# Patient Record
Sex: Female | Born: 1940 | Race: White | Hispanic: No
Health system: Southern US, Community
[De-identification: ages and names within clinical notes are randomized; demographics above are authoritative.]

---

## 2006-01-03 ENCOUNTER — Emergency Department: Payer: Self-pay | Admitting: Emergency Medicine

## 2006-01-07 ENCOUNTER — Ambulatory Visit: Payer: Self-pay | Admitting: Family Medicine

## 2006-12-15 ENCOUNTER — Ambulatory Visit: Payer: Self-pay | Admitting: Gastroenterology

## 2007-01-28 ENCOUNTER — Ambulatory Visit: Payer: Self-pay | Admitting: Family Medicine

## 2007-03-04 ENCOUNTER — Ambulatory Visit: Payer: Self-pay | Admitting: Family Medicine

## 2007-12-22 ENCOUNTER — Ambulatory Visit: Payer: Self-pay | Admitting: Gastroenterology

## 2008-01-09 ENCOUNTER — Ambulatory Visit: Payer: Self-pay | Admitting: Internal Medicine

## 2008-01-17 ENCOUNTER — Ambulatory Visit: Payer: Self-pay | Admitting: Internal Medicine

## 2008-01-28 ENCOUNTER — Inpatient Hospital Stay: Payer: Self-pay | Admitting: Internal Medicine

## 2008-01-28 ENCOUNTER — Other Ambulatory Visit: Payer: Self-pay

## 2008-02-06 ENCOUNTER — Ambulatory Visit: Payer: Self-pay | Admitting: Internal Medicine

## 2008-02-22 ENCOUNTER — Ambulatory Visit: Payer: Self-pay | Admitting: Gastroenterology

## 2008-03-08 ENCOUNTER — Ambulatory Visit: Payer: Self-pay | Admitting: Internal Medicine

## 2008-04-07 ENCOUNTER — Ambulatory Visit: Payer: Self-pay | Admitting: Internal Medicine

## 2008-05-08 ENCOUNTER — Ambulatory Visit: Payer: Self-pay | Admitting: Internal Medicine

## 2008-06-07 ENCOUNTER — Ambulatory Visit: Payer: Self-pay | Admitting: Internal Medicine

## 2008-06-28 ENCOUNTER — Ambulatory Visit: Payer: Self-pay | Admitting: Surgery

## 2008-07-05 ENCOUNTER — Ambulatory Visit: Payer: Self-pay | Admitting: Surgery

## 2008-07-08 ENCOUNTER — Ambulatory Visit: Payer: Self-pay | Admitting: Internal Medicine

## 2008-07-18 ENCOUNTER — Ambulatory Visit: Payer: Self-pay | Admitting: Internal Medicine

## 2008-08-08 ENCOUNTER — Ambulatory Visit: Payer: Self-pay | Admitting: Internal Medicine

## 2008-09-07 ENCOUNTER — Ambulatory Visit: Payer: Self-pay | Admitting: Internal Medicine

## 2008-10-08 ENCOUNTER — Ambulatory Visit: Payer: Self-pay | Admitting: Internal Medicine

## 2008-11-07 ENCOUNTER — Ambulatory Visit: Payer: Self-pay | Admitting: Internal Medicine

## 2008-12-08 ENCOUNTER — Ambulatory Visit: Payer: Self-pay | Admitting: Internal Medicine

## 2008-12-14 ENCOUNTER — Ambulatory Visit: Payer: Self-pay | Admitting: Internal Medicine

## 2008-12-20 ENCOUNTER — Ambulatory Visit: Payer: Self-pay | Admitting: Specialist

## 2009-01-03 ENCOUNTER — Ambulatory Visit: Payer: Self-pay | Admitting: Specialist

## 2009-01-06 ENCOUNTER — Emergency Department: Payer: Self-pay | Admitting: Unknown Physician Specialty

## 2009-01-08 ENCOUNTER — Ambulatory Visit: Payer: Self-pay | Admitting: Internal Medicine

## 2009-03-08 ENCOUNTER — Ambulatory Visit: Payer: Self-pay | Admitting: Internal Medicine

## 2009-03-19 ENCOUNTER — Ambulatory Visit: Payer: Self-pay | Admitting: Family Medicine

## 2009-03-22 ENCOUNTER — Ambulatory Visit: Payer: Self-pay | Admitting: Internal Medicine

## 2009-04-03 ENCOUNTER — Ambulatory Visit: Payer: Self-pay | Admitting: Gastroenterology

## 2009-04-07 ENCOUNTER — Ambulatory Visit: Payer: Self-pay | Admitting: Internal Medicine

## 2009-05-08 ENCOUNTER — Ambulatory Visit: Payer: Self-pay | Admitting: Internal Medicine

## 2009-06-07 ENCOUNTER — Ambulatory Visit: Payer: Self-pay | Admitting: Internal Medicine

## 2009-07-08 ENCOUNTER — Ambulatory Visit: Payer: Self-pay | Admitting: Internal Medicine

## 2009-08-16 ENCOUNTER — Ambulatory Visit: Payer: Self-pay | Admitting: Internal Medicine

## 2009-09-07 ENCOUNTER — Ambulatory Visit: Payer: Self-pay | Admitting: Internal Medicine

## 2009-10-08 ENCOUNTER — Ambulatory Visit: Payer: Self-pay | Admitting: Internal Medicine

## 2009-10-15 ENCOUNTER — Ambulatory Visit: Payer: Self-pay | Admitting: Internal Medicine

## 2009-11-07 ENCOUNTER — Ambulatory Visit: Payer: Self-pay | Admitting: Internal Medicine

## 2009-12-17 ENCOUNTER — Ambulatory Visit: Payer: Self-pay | Admitting: Internal Medicine

## 2010-01-08 ENCOUNTER — Ambulatory Visit: Payer: Self-pay | Admitting: Internal Medicine

## 2010-01-21 ENCOUNTER — Ambulatory Visit: Payer: Self-pay | Admitting: Gastroenterology

## 2010-02-14 ENCOUNTER — Ambulatory Visit: Payer: Self-pay | Admitting: Internal Medicine

## 2010-03-08 ENCOUNTER — Ambulatory Visit: Payer: Self-pay | Admitting: Internal Medicine

## 2010-03-20 ENCOUNTER — Ambulatory Visit: Payer: Self-pay | Admitting: Family Medicine

## 2010-04-07 ENCOUNTER — Ambulatory Visit: Payer: Self-pay | Admitting: Internal Medicine

## 2010-04-15 ENCOUNTER — Ambulatory Visit: Payer: Self-pay | Admitting: Internal Medicine

## 2010-05-08 ENCOUNTER — Ambulatory Visit: Payer: Self-pay | Admitting: Internal Medicine

## 2010-06-07 ENCOUNTER — Ambulatory Visit: Payer: Self-pay | Admitting: Internal Medicine

## 2010-06-24 ENCOUNTER — Ambulatory Visit: Payer: Self-pay | Admitting: Gastroenterology

## 2010-07-08 ENCOUNTER — Ambulatory Visit: Payer: Self-pay | Admitting: Internal Medicine

## 2010-07-25 ENCOUNTER — Ambulatory Visit: Payer: Self-pay | Admitting: Gastroenterology

## 2010-08-08 ENCOUNTER — Ambulatory Visit: Payer: Self-pay | Admitting: Internal Medicine

## 2010-08-26 ENCOUNTER — Inpatient Hospital Stay: Payer: Self-pay | Admitting: Internal Medicine

## 2010-09-07 ENCOUNTER — Ambulatory Visit: Payer: Self-pay | Admitting: Internal Medicine

## 2010-09-16 ENCOUNTER — Ambulatory Visit: Payer: Self-pay | Admitting: Family Medicine

## 2010-09-19 ENCOUNTER — Ambulatory Visit: Payer: Self-pay | Admitting: Gastroenterology

## 2010-09-20 ENCOUNTER — Emergency Department: Payer: Self-pay | Admitting: Emergency Medicine

## 2010-09-22 ENCOUNTER — Inpatient Hospital Stay: Payer: Self-pay | Admitting: Internal Medicine

## 2010-10-08 ENCOUNTER — Ambulatory Visit: Payer: Self-pay | Admitting: Internal Medicine

## 2010-10-16 ENCOUNTER — Emergency Department: Payer: Self-pay | Admitting: Emergency Medicine

## 2010-11-04 ENCOUNTER — Ambulatory Visit: Payer: Self-pay | Admitting: Urology

## 2010-11-07 ENCOUNTER — Ambulatory Visit: Payer: Self-pay | Admitting: Internal Medicine

## 2010-11-11 ENCOUNTER — Observation Stay: Payer: Self-pay | Admitting: Internal Medicine

## 2010-12-08 ENCOUNTER — Ambulatory Visit: Payer: Self-pay | Admitting: Internal Medicine

## 2011-01-08 ENCOUNTER — Ambulatory Visit: Payer: Self-pay | Admitting: Internal Medicine

## 2011-02-06 ENCOUNTER — Ambulatory Visit: Payer: Self-pay | Admitting: Internal Medicine

## 2011-02-23 ENCOUNTER — Emergency Department: Payer: Self-pay | Admitting: Emergency Medicine

## 2011-03-01 ENCOUNTER — Emergency Department: Payer: Self-pay | Admitting: Emergency Medicine

## 2011-03-04 LAB — CEA: CEA: 4.3 ng/mL (ref 0.0–4.7)

## 2011-03-09 ENCOUNTER — Ambulatory Visit: Payer: Self-pay | Admitting: Internal Medicine

## 2011-04-08 ENCOUNTER — Ambulatory Visit: Payer: Self-pay | Admitting: Internal Medicine

## 2011-04-25 ENCOUNTER — Emergency Department: Payer: Self-pay | Admitting: Emergency Medicine

## 2011-05-09 ENCOUNTER — Ambulatory Visit: Payer: Self-pay | Admitting: Internal Medicine

## 2011-05-23 ENCOUNTER — Observation Stay: Payer: Self-pay | Admitting: Internal Medicine

## 2011-06-08 ENCOUNTER — Ambulatory Visit: Payer: Self-pay | Admitting: Internal Medicine

## 2011-06-18 ENCOUNTER — Ambulatory Visit: Payer: Self-pay | Admitting: Urology

## 2011-06-21 LAB — CA 125: CA 125: 447.2 U/mL — ABNORMAL HIGH (ref 0.0–34.0)

## 2011-07-09 ENCOUNTER — Ambulatory Visit: Payer: Self-pay | Admitting: Internal Medicine

## 2011-07-30 LAB — CA 125: CA 125: 152.5 U/mL — ABNORMAL HIGH (ref 0.0–34.0)

## 2011-08-01 LAB — CA 125: CA 125: 182.4 U/mL — ABNORMAL HIGH (ref 0.0–34.0)

## 2011-08-09 ENCOUNTER — Ambulatory Visit: Payer: Self-pay | Admitting: Internal Medicine

## 2011-09-08 ENCOUNTER — Ambulatory Visit: Payer: Self-pay | Admitting: Internal Medicine

## 2011-09-11 ENCOUNTER — Ambulatory Visit: Payer: Self-pay | Admitting: Vascular Surgery

## 2011-09-12 ENCOUNTER — Ambulatory Visit: Payer: Self-pay | Admitting: Internal Medicine

## 2011-10-09 ENCOUNTER — Ambulatory Visit: Payer: Self-pay | Admitting: Internal Medicine

## 2011-11-05 LAB — CA 125: CA 125: 814.8 U/mL — ABNORMAL HIGH (ref 0.0–34.0)

## 2011-11-07 LAB — CEA: CEA: 4.8 ng/mL — ABNORMAL HIGH (ref 0.0–4.7)

## 2011-11-08 ENCOUNTER — Ambulatory Visit: Payer: Self-pay | Admitting: Internal Medicine

## 2011-12-09 ENCOUNTER — Ambulatory Visit: Payer: Self-pay | Admitting: Internal Medicine

## 2011-12-10 LAB — CANCER CENTER HEMOGLOBIN: HGB: 10.1 g/dL — ABNORMAL LOW (ref 12.0–16.0)

## 2011-12-17 LAB — CANCER CENTER HEMOGLOBIN: HGB: 9.4 g/dL — ABNORMAL LOW (ref 12.0–16.0)

## 2011-12-22 LAB — CREATININE, SERUM
Creatinine: 1.13 mg/dL (ref 0.60–1.30)
EGFR (African American): >60
EGFR (Non-African Amer.): 50 — ABNORMAL LOW

## 2011-12-29 LAB — FERRITIN: Ferritin (ARMC): 57 ng/mL (ref 8–388)

## 2012-01-02 LAB — CANCER CENTER HEMOGLOBIN: HGB: 10.9 g/dL — ABNORMAL LOW (ref 12.0–16.0)

## 2012-01-09 ENCOUNTER — Ambulatory Visit: Payer: Self-pay | Admitting: Internal Medicine

## 2012-01-26 LAB — CANCER CENTER HEMOGLOBIN: HGB: 10.1 g/dL — ABNORMAL LOW (ref 12.0–16.0)

## 2012-01-26 LAB — FERRITIN: Ferritin (ARMC): 60 ng/mL (ref 8–388)

## 2012-02-04 LAB — CANCER CENTER HEMOGLOBIN: HGB: 10.4 g/dL — ABNORMAL LOW (ref 12.0–16.0)

## 2012-02-06 ENCOUNTER — Ambulatory Visit: Payer: Self-pay | Admitting: Internal Medicine

## 2012-02-09 LAB — CANCER CENTER HEMOGLOBIN: HGB: 10.2 g/dL — ABNORMAL LOW (ref 12.0–16.0)

## 2012-02-23 LAB — CANCER CENTER HEMOGLOBIN: HGB: 10.1 g/dL — ABNORMAL LOW (ref 12.0–16.0)

## 2012-02-23 LAB — HEPATIC FUNCTION PANEL A (ARMC)
SGOT(AST): 31 U/L (ref 15–37)
SGPT (ALT): 21 U/L

## 2012-03-03 ENCOUNTER — Ambulatory Visit: Payer: Self-pay | Admitting: Gastroenterology

## 2012-03-08 ENCOUNTER — Ambulatory Visit: Payer: Self-pay | Admitting: Internal Medicine

## 2012-03-08 LAB — CANCER CENTER HEMOGLOBIN: HGB: 10.9 g/dL — ABNORMAL LOW (ref 12.0–16.0)

## 2012-03-15 LAB — CANCER CENTER HEMOGLOBIN: HGB: 11.7 g/dL — ABNORMAL LOW (ref 12.0–16.0)

## 2012-03-31 LAB — CANCER CENTER HEMOGLOBIN: HGB: 11.4 g/dL — ABNORMAL LOW (ref 12.0–16.0)

## 2012-04-07 ENCOUNTER — Ambulatory Visit: Payer: Self-pay | Admitting: Internal Medicine

## 2012-04-11 ENCOUNTER — Inpatient Hospital Stay: Payer: Self-pay | Admitting: Internal Medicine

## 2012-04-11 LAB — URINALYSIS, COMPLETE
Bilirubin,UR: NEGATIVE
Nitrite: NEGATIVE
Ph: 5 (ref 4.5–8.0)
Protein: NEGATIVE

## 2012-04-11 LAB — COMPREHENSIVE METABOLIC PANEL
Alkaline Phosphatase: 143 U/L — ABNORMAL HIGH (ref 50–136)
Anion Gap: 8 (ref 7–16)
BUN: 22 mg/dL — ABNORMAL HIGH (ref 7–18)
Bilirubin,Total: 1.1 mg/dL — ABNORMAL HIGH (ref 0.2–1.0)
Calcium, Total: 8.8 mg/dL (ref 8.5–10.1)
Chloride: 106 mmol/L (ref 98–107)
Co2: 25 mmol/L (ref 21–32)
Creatinine: 0.93 mg/dL (ref 0.60–1.30)
EGFR (African American): >60
EGFR (Non-African Amer.): >60
Glucose: 148 mg/dL — ABNORMAL HIGH (ref 65–99)
Potassium: 4.2 mmol/L (ref 3.5–5.1)
SGOT(AST): 38 U/L — ABNORMAL HIGH (ref 15–37)
SGPT (ALT): 21 U/L
Total Protein: 8.3 g/dL — ABNORMAL HIGH (ref 6.4–8.2)

## 2012-04-11 LAB — CBC WITH DIFFERENTIAL/PLATELET
Basophil %: 0.1 %
Eosinophil #: 0.6 10*3/uL (ref 0.0–0.7)
Eosinophil %: 8.5 %
HCT: 34.1 % — ABNORMAL LOW (ref 35.0–47.0)
Lymphocyte #: 0.7 10*3/uL — ABNORMAL LOW (ref 1.0–3.6)
MCH: 33.5 pg (ref 26.0–34.0)
MCHC: 33.4 g/dL (ref 32.0–36.0)
MCV: 100 fL (ref 80–100)
RBC: 3.4 10*6/uL — ABNORMAL LOW (ref 3.80–5.20)
RDW: 16 % — ABNORMAL HIGH (ref 11.5–14.5)
WBC: 7.2 10*3/uL (ref 3.6–11.0)

## 2012-04-11 LAB — MAGNESIUM: Magnesium: 1.4 mg/dL — ABNORMAL LOW

## 2012-04-11 LAB — APTT: Activated PTT: 34.3 secs (ref 23.6–35.9)

## 2012-04-11 LAB — PROTIME-INR: Prothrombin Time: 15.8 secs — ABNORMAL HIGH (ref 11.5–14.7)

## 2012-04-11 LAB — AMMONIA: Ammonia, Plasma: 126 mcmol/L — ABNORMAL HIGH (ref 11–32)

## 2012-04-12 LAB — CBC WITH DIFFERENTIAL/PLATELET
Basophil #: 0 10*3/uL (ref 0.0–0.1)
Basophil %: 0.3 %
Eosinophil #: 0.4 10*3/uL (ref 0.0–0.7)
HCT: 28.5 % — ABNORMAL LOW (ref 35.0–47.0)
Lymphocyte #: 0.9 10*3/uL — ABNORMAL LOW (ref 1.0–3.6)
Lymphocyte %: 16.7 %
MCHC: 34 g/dL (ref 32.0–36.0)
MCV: 99 fL (ref 80–100)
Monocyte #: 0.6 x10 3/mm (ref 0.2–0.9)
Neutrophil #: 3.3 10*3/uL (ref 1.4–6.5)
Neutrophil %: 63.9 %
Platelet: 83 10*3/uL — ABNORMAL LOW (ref 150–440)
RBC: 2.88 10*6/uL — ABNORMAL LOW (ref 3.80–5.20)
RDW: 16 % — ABNORMAL HIGH (ref 11.5–14.5)
WBC: 5.1 10*3/uL (ref 3.6–11.0)

## 2012-04-12 LAB — COMPREHENSIVE METABOLIC PANEL
Albumin: 2.1 g/dL — ABNORMAL LOW (ref 3.4–5.0)
Alkaline Phosphatase: 116 U/L (ref 50–136)
Anion Gap: 7 (ref 7–16)
BUN: 21 mg/dL — ABNORMAL HIGH (ref 7–18)
Calcium, Total: 8.3 mg/dL — ABNORMAL LOW (ref 8.5–10.1)
Chloride: 108 mmol/L — ABNORMAL HIGH (ref 98–107)
Creatinine: 0.97 mg/dL (ref 0.60–1.30)
EGFR (African American): >60
EGFR (Non-African Amer.): 58 — ABNORMAL LOW
Glucose: 140 mg/dL — ABNORMAL HIGH (ref 65–99)
SGPT (ALT): 19 U/L
Total Protein: 7 g/dL (ref 6.4–8.2)

## 2012-04-12 LAB — HEMOGLOBIN A1C: Hemoglobin A1C: 6.4 % — ABNORMAL HIGH (ref 4.2–6.3)

## 2012-04-12 LAB — MAGNESIUM: Magnesium: 1.5 mg/dL — ABNORMAL LOW

## 2012-04-13 LAB — HEMOGLOBIN: HGB: 10.6 g/dL — ABNORMAL LOW (ref 12.0–16.0)

## 2012-04-19 LAB — CANCER CENTER HEMOGLOBIN: HGB: 10.7 g/dL — ABNORMAL LOW (ref 12.0–16.0)

## 2012-04-19 LAB — PLATELET COUNT: Platelet: 147 10*3/uL — ABNORMAL LOW (ref 150–440)

## 2012-04-26 LAB — CBC CANCER CENTER
Eosinophil #: 0.5 x10 3/mm (ref 0.0–0.7)
Eosinophil %: 7 %
HGB: 10.1 g/dL — ABNORMAL LOW (ref 12.0–16.0)
MCHC: 32.6 g/dL (ref 32.0–36.0)
MCV: 102 fL — ABNORMAL HIGH (ref 80–100)
Monocyte #: 0.7 x10 3/mm (ref 0.2–0.9)
Monocyte %: 10.2 %
Neutrophil #: 4.4 x10 3/mm (ref 1.4–6.5)
Neutrophil %: 68.2 %
Platelet: 115 x10 3/mm — ABNORMAL LOW (ref 150–440)
RBC: 3.05 10*6/uL — ABNORMAL LOW (ref 3.80–5.20)
RDW: 16.6 % — ABNORMAL HIGH (ref 11.5–14.5)

## 2012-05-05 ENCOUNTER — Ambulatory Visit: Payer: Self-pay | Admitting: Gastroenterology

## 2012-05-05 LAB — APTT: Activated PTT: 34.5 secs (ref 23.6–35.9)

## 2012-05-05 LAB — PROTIME-INR: Prothrombin Time: 16.4 secs — ABNORMAL HIGH (ref 11.5–14.7)

## 2012-05-05 LAB — CBC WITH DIFFERENTIAL/PLATELET
Basophil #: 0 10*3/uL (ref 0.0–0.1)
Basophil %: 0.4 %
Eosinophil #: 0.5 10*3/uL (ref 0.0–0.7)
HGB: 10.7 g/dL — ABNORMAL LOW (ref 12.0–16.0)
Lymphocyte %: 16.7 %
MCHC: 33 g/dL (ref 32.0–36.0)
MCV: 100 fL (ref 80–100)
Neutrophil #: 3.9 10*3/uL (ref 1.4–6.5)
Platelet: 97 10*3/uL — ABNORMAL LOW (ref 150–440)
RBC: 3.25 10*6/uL — ABNORMAL LOW (ref 3.80–5.20)
RDW: 18.8 % — ABNORMAL HIGH (ref 11.5–14.5)
WBC: 5.9 10*3/uL (ref 3.6–11.0)

## 2012-05-07 LAB — CANCER CENTER HEMOGLOBIN: HGB: 10.2 g/dL — ABNORMAL LOW (ref 12.0–16.0)

## 2012-05-08 ENCOUNTER — Ambulatory Visit: Payer: Self-pay | Admitting: Internal Medicine

## 2012-06-07 ENCOUNTER — Ambulatory Visit: Payer: Self-pay | Admitting: Internal Medicine

## 2012-06-07 LAB — CBC CANCER CENTER
Basophil #: 0 x10 3/mm (ref 0.0–0.1)
Eosinophil #: 0.3 x10 3/mm (ref 0.0–0.7)
Eosinophil %: 6.5 %
HGB: 11.3 g/dL — ABNORMAL LOW (ref 12.0–16.0)
Lymphocyte #: 0.9 x10 3/mm — ABNORMAL LOW (ref 1.0–3.6)
Lymphocyte %: 17.5 %
MCHC: 32.3 g/dL (ref 32.0–36.0)
Monocyte %: 9.9 %
Platelet: 98 x10 3/mm — ABNORMAL LOW (ref 150–440)
RDW: 16.1 % — ABNORMAL HIGH (ref 11.5–14.5)
WBC: 5.1 x10 3/mm (ref 3.6–11.0)

## 2012-06-18 LAB — CANCER CENTER HEMOGLOBIN: HGB: 10.9 g/dL — ABNORMAL LOW (ref 12.0–16.0)

## 2012-06-18 LAB — IRON AND TIBC
Iron Bind.Cap.(Total): 307 ug/dL (ref 250–450)
Iron Saturation: 70 %
Unbound Iron-Bind.Cap.: 92 ug/dL

## 2012-06-18 LAB — RETICULOCYTES
Absolute Retic Count: 0.0713 10*6/uL (ref 0.024–0.084)
Reticulocyte: 2.15 % — ABNORMAL HIGH (ref 0.5–1.5)

## 2012-06-22 ENCOUNTER — Ambulatory Visit: Payer: Self-pay | Admitting: Vascular Surgery

## 2012-06-28 LAB — CANCER CENTER HEMOGLOBIN: HGB: 9.6 g/dL — ABNORMAL LOW (ref 12.0–16.0)

## 2012-07-01 ENCOUNTER — Inpatient Hospital Stay: Payer: Self-pay | Admitting: Internal Medicine

## 2012-07-01 LAB — COMPREHENSIVE METABOLIC PANEL
Albumin: 2.4 g/dL — ABNORMAL LOW (ref 3.4–5.0)
Alkaline Phosphatase: 152 U/L — ABNORMAL HIGH (ref 50–136)
BUN: 21 mg/dL — ABNORMAL HIGH (ref 7–18)
Calcium, Total: 8.7 mg/dL (ref 8.5–10.1)
EGFR (Non-African Amer.): 51 — ABNORMAL LOW
Glucose: 255 mg/dL — ABNORMAL HIGH (ref 65–99)
Osmolality: 287 (ref 275–301)
Potassium: 4.6 mmol/L (ref 3.5–5.1)
Sodium: 138 mmol/L (ref 136–145)

## 2012-07-01 LAB — URINALYSIS, COMPLETE
Bacteria: NONE SEEN
Bilirubin,UR: NEGATIVE
Blood: NEGATIVE
Glucose,UR: NEGATIVE mg/dL (ref 0–75)
Ketone: NEGATIVE
Protein: NEGATIVE
RBC,UR: <1 /HPF (ref 0–5)
Specific Gravity: 1.011 (ref 1.003–1.030)
Squamous Epithelial: 1
WBC UR: 1 /HPF (ref 0–5)

## 2012-07-01 LAB — AMMONIA: Ammonia, Plasma: 66 mcmol/L — ABNORMAL HIGH (ref 11–32)

## 2012-07-01 LAB — CBC
HCT: 31.7 % — ABNORMAL LOW (ref 35.0–47.0)
HGB: 10.4 g/dL — ABNORMAL LOW (ref 12.0–16.0)
MCH: 32.9 pg (ref 26.0–34.0)
MCHC: 32.8 g/dL (ref 32.0–36.0)
MCV: 100 fL (ref 80–100)
Platelet: 98 10*3/uL — ABNORMAL LOW (ref 150–440)
RDW: 19.8 % — ABNORMAL HIGH (ref 11.5–14.5)
WBC: 5.6 10*3/uL (ref 3.6–11.0)

## 2012-07-01 LAB — FERRITIN: Ferritin (ARMC): 217 ng/mL (ref 8–388)

## 2012-07-01 LAB — CANCER CENTER HEMOGLOBIN: HGB: 11.1 g/dL — ABNORMAL LOW (ref 12.0–16.0)

## 2012-07-01 LAB — PROTIME-INR: INR: 1.3

## 2012-07-01 LAB — MAGNESIUM: Magnesium: 1.6 mg/dL — ABNORMAL LOW

## 2012-07-02 LAB — CBC WITH DIFFERENTIAL/PLATELET
Basophil #: 0 x10 3/mm 3 (ref 0.0–0.1)
Basophil %: 0.3 %
Eosinophil #: 0.2 x10 3/mm 3 (ref 0.0–0.7)
Eosinophil %: 5.6 %
HCT: 30.2 % — ABNORMAL LOW (ref 35.0–47.0)
HGB: 10.1 g/dL — ABNORMAL LOW (ref 12.0–16.0)
Lymphocyte %: 20 %
Lymphs Abs: 0.8 x10 3/mm 3 — ABNORMAL LOW (ref 1.0–3.6)
MCH: 33 pg (ref 26.0–34.0)
MCHC: 33.5 g/dL (ref 32.0–36.0)
MCV: 99 fL (ref 80–100)
Monocyte #: 0.5 x10 3/mm (ref 0.2–0.9)
Monocyte %: 11.3 %
Neutrophil #: 2.5 x10 3/mm 3 (ref 1.4–6.5)
Neutrophil %: 62.8 %
Platelet: 74 x10 3/mm 3 — ABNORMAL LOW (ref 150–440)
RBC: 3.06 X10 6/mm 3 — ABNORMAL LOW (ref 3.80–5.20)
RDW: 19.5 % — ABNORMAL HIGH (ref 11.5–14.5)
WBC: 4.1 x10 3/mm 3 (ref 3.6–11.0)

## 2012-07-02 LAB — BASIC METABOLIC PANEL
Anion Gap: 8 (ref 7–16)
BUN: 20 mg/dL — ABNORMAL HIGH (ref 7–18)
Chloride: 106 mmol/L (ref 98–107)
Co2: 25 mmol/L (ref 21–32)
Creatinine: 1.08 mg/dL (ref 0.60–1.30)
EGFR (African American): 59 — ABNORMAL LOW
Osmolality: 286 (ref 275–301)
Potassium: 4 mmol/L (ref 3.5–5.1)

## 2012-07-02 LAB — AMMONIA: Ammonia, Plasma: 91 umol/L — ABNORMAL HIGH (ref 11–32)

## 2012-07-08 ENCOUNTER — Ambulatory Visit: Payer: Self-pay | Admitting: Internal Medicine

## 2012-07-08 LAB — CBC CANCER CENTER
Basophil %: 0.5 %
Eosinophil #: 0.4 x10 3/mm (ref 0.0–0.7)
Eosinophil %: 6.6 %
HCT: 31.7 % — ABNORMAL LOW (ref 35.0–47.0)
HGB: 10.8 g/dL — ABNORMAL LOW (ref 12.0–16.0)
Lymphocyte #: 0.9 x10 3/mm — ABNORMAL LOW (ref 1.0–3.6)
Lymphocyte %: 14.6 %
MCH: 33.8 pg (ref 26.0–34.0)
MCHC: 34 g/dL (ref 32.0–36.0)
MCV: 100 fL (ref 80–100)
Monocyte #: 0.6 x10 3/mm (ref 0.2–0.9)
Neutrophil #: 4.1 x10 3/mm (ref 1.4–6.5)
RDW: 19 % — ABNORMAL HIGH (ref 11.5–14.5)
WBC: 6.1 x10 3/mm (ref 3.6–11.0)

## 2012-07-15 LAB — CBC CANCER CENTER
Basophil #: 0 x10 3/mm (ref 0.0–0.1)
Eosinophil #: 0.3 x10 3/mm (ref 0.0–0.7)
Eosinophil %: 7.1 %
Lymphocyte #: 0.6 x10 3/mm — ABNORMAL LOW (ref 1.0–3.6)
MCH: 33.6 pg (ref 26.0–34.0)
MCHC: 33.7 g/dL (ref 32.0–36.0)
MCV: 100 fL (ref 80–100)
Monocyte #: 0.5 x10 3/mm (ref 0.2–0.9)
Neutrophil #: 3 x10 3/mm (ref 1.4–6.5)
Platelet: 93 x10 3/mm — ABNORMAL LOW (ref 150–440)
RDW: 17 % — ABNORMAL HIGH (ref 11.5–14.5)

## 2012-07-22 LAB — CANCER CENTER HEMOGLOBIN: HGB: 10.6 g/dL — ABNORMAL LOW (ref 12.0–16.0)

## 2012-08-06 LAB — CANCER CENTER HEMOGLOBIN: HGB: 10.1 g/dL — ABNORMAL LOW (ref 12.0–16.0)

## 2012-08-08 ENCOUNTER — Ambulatory Visit: Payer: Self-pay | Admitting: Internal Medicine

## 2012-08-10 LAB — CANCER CENTER HEMOGLOBIN: HGB: 9.9 g/dL — ABNORMAL LOW (ref 12.0–16.0)

## 2012-08-11 ENCOUNTER — Ambulatory Visit: Payer: Self-pay | Admitting: Gastroenterology

## 2012-08-13 LAB — CBC CANCER CENTER
Basophil %: 0.8 %
Eosinophil %: 8.5 %
HGB: 10.2 g/dL — ABNORMAL LOW (ref 12.0–16.0)
Lymphocyte %: 15.9 %
MCH: 32.5 pg (ref 26.0–34.0)
MCV: 101 fL — ABNORMAL HIGH (ref 80–100)
Monocyte %: 9.4 %
Neutrophil %: 65.4 %
RBC: 3.15 10*6/uL — ABNORMAL LOW (ref 3.80–5.20)
WBC: 6.7 x10 3/mm (ref 3.6–11.0)

## 2012-08-17 ENCOUNTER — Inpatient Hospital Stay: Payer: Self-pay | Admitting: Internal Medicine

## 2012-08-17 LAB — CBC
HCT: 33 % — ABNORMAL LOW (ref 35.0–47.0)
HGB: 11.1 g/dL — ABNORMAL LOW (ref 12.0–16.0)
MCHC: 33.8 g/dL (ref 32.0–36.0)
MCV: 99 fL (ref 80–100)
WBC: 10.2 10*3/uL (ref 3.6–11.0)

## 2012-08-17 LAB — URINALYSIS, COMPLETE
Glucose,UR: NEGATIVE mg/dL (ref 0–75)
Hyaline Cast: 16
Leukocyte Esterase: NEGATIVE
Nitrite: NEGATIVE
Protein: NEGATIVE
RBC,UR: 1 /HPF (ref 0–5)
Specific Gravity: 1.016 (ref 1.003–1.030)
Squamous Epithelial: 15
WBC UR: 3 /HPF (ref 0–5)

## 2012-08-17 LAB — COMPREHENSIVE METABOLIC PANEL
Albumin: 2.1 g/dL — ABNORMAL LOW (ref 3.4–5.0)
Alkaline Phosphatase: 188 U/L — ABNORMAL HIGH (ref 50–136)
BUN: 14 mg/dL (ref 7–18)
Calcium, Total: 8.5 mg/dL (ref 8.5–10.1)
EGFR (African American): >60
Glucose: 242 mg/dL — ABNORMAL HIGH (ref 65–99)
Osmolality: 280 (ref 275–301)
Potassium: 3.6 mmol/L (ref 3.5–5.1)
SGOT(AST): 35 U/L (ref 15–37)
Sodium: 136 mmol/L (ref 136–145)
Total Protein: 8.1 g/dL (ref 6.4–8.2)

## 2012-08-17 LAB — APTT: Activated PTT: 39.2 secs — ABNORMAL HIGH (ref 23.6–35.9)

## 2012-08-17 LAB — LIPASE, BLOOD: Lipase: 107 U/L (ref 73–393)

## 2012-08-17 LAB — PROTIME-INR: INR: 1.5

## 2012-08-18 LAB — BASIC METABOLIC PANEL
Anion Gap: 9 (ref 7–16)
BUN: 18 mg/dL (ref 7–18)
Calcium, Total: 8 mg/dL — ABNORMAL LOW (ref 8.5–10.1)
Co2: 23 mmol/L (ref 21–32)
EGFR (African American): 54 — ABNORMAL LOW
EGFR (Non-African Amer.): 47 — ABNORMAL LOW
Glucose: 164 mg/dL — ABNORMAL HIGH (ref 65–99)
Osmolality: 281 (ref 275–301)
Potassium: 3.6 mmol/L (ref 3.5–5.1)

## 2012-08-18 LAB — CBC WITH DIFFERENTIAL/PLATELET
Basophil #: 0.1 10*3/uL (ref 0.0–0.1)
Eosinophil %: 1.7 %
HCT: 30.5 % — ABNORMAL LOW (ref 35.0–47.0)
Lymphocyte #: 1.4 10*3/uL (ref 1.0–3.6)
MCH: 33.4 pg (ref 26.0–34.0)
MCV: 99 fL (ref 80–100)
Monocyte #: 1.8 x10 3/mm — ABNORMAL HIGH (ref 0.2–0.9)
Neutrophil #: 7.6 10*3/uL — ABNORMAL HIGH (ref 1.4–6.5)
Neutrophil %: 69 %
Platelet: 134 10*3/uL — ABNORMAL LOW (ref 150–440)
RDW: 17 % — ABNORMAL HIGH (ref 11.5–14.5)

## 2012-09-01 LAB — CANCER CENTER HEMOGLOBIN: HGB: 10.6 g/dL — ABNORMAL LOW (ref 12.0–16.0)

## 2012-09-07 ENCOUNTER — Ambulatory Visit: Payer: Self-pay | Admitting: Internal Medicine

## 2012-09-08 LAB — AMMONIA: Ammonia, Plasma: <25 mcmol/L (ref 11–32)

## 2012-09-08 LAB — MAGNESIUM: Magnesium: 1.4 mg/dL — ABNORMAL LOW

## 2012-09-08 LAB — CREATININE, SERUM: Creatinine: 1.04 mg/dL (ref 0.60–1.30)

## 2012-09-08 LAB — CANCER CENTER HEMOGLOBIN: HGB: 11 g/dL — ABNORMAL LOW (ref 12.0–16.0)

## 2012-09-08 LAB — CLOSTRIDIUM DIFFICILE BY PCR

## 2012-09-14 LAB — COMPREHENSIVE METABOLIC PANEL WITH GFR
Albumin: 2 g/dL — ABNORMAL LOW
Alkaline Phosphatase: 199 U/L — ABNORMAL HIGH
Anion Gap: 5 — ABNORMAL LOW
BUN: 13 mg/dL
Bilirubin,Total: 1 mg/dL
Calcium, Total: 8.3 mg/dL — ABNORMAL LOW
Chloride: 107 mmol/L
Co2: 29 mmol/L
Creatinine: 1.07 mg/dL
EGFR (African American): >60
EGFR (Non-African Amer.): 52 — ABNORMAL LOW
Glucose: 136 mg/dL — ABNORMAL HIGH
Osmolality: 283
Potassium: 3.6 mmol/L
SGOT(AST): 34 U/L
SGPT (ALT): 16 U/L
Sodium: 141 mmol/L
Total Protein: 8 g/dL

## 2012-09-14 LAB — CBC
HCT: 32.4 % — ABNORMAL LOW
HGB: 10.9 g/dL — ABNORMAL LOW
MCH: 34.2 pg — ABNORMAL HIGH
MCHC: 33.7 g/dL
MCV: 101 fL — ABNORMAL HIGH
Platelet: 97 x10 3/mm 3 — ABNORMAL LOW
RBC: 3.2 X10 6/mm 3 — ABNORMAL LOW
RDW: 16.7 % — ABNORMAL HIGH
WBC: 4.9 x10 3/mm 3

## 2012-09-14 LAB — URINALYSIS, COMPLETE
Bilirubin,UR: NEGATIVE
Ketone: NEGATIVE
Nitrite: NEGATIVE
Ph: 6 (ref 4.5–8.0)
Protein: 30
RBC,UR: 25 /HPF (ref 0–5)
Squamous Epithelial: 17

## 2012-09-15 ENCOUNTER — Inpatient Hospital Stay: Payer: Self-pay | Admitting: Internal Medicine

## 2012-09-15 LAB — CBC
MCHC: 34.2 g/dL (ref 32.0–36.0)
Platelet: 108 10*3/uL — ABNORMAL LOW (ref 150–440)
RDW: 16.9 % — ABNORMAL HIGH (ref 11.5–14.5)
WBC: 3.1 10*3/uL — ABNORMAL LOW (ref 3.6–11.0)

## 2012-09-15 LAB — PROTIME-INR
INR: 1.6
Prothrombin Time: 19.4 secs — ABNORMAL HIGH (ref 11.5–14.7)

## 2012-09-15 LAB — CLOSTRIDIUM DIFFICILE BY PCR

## 2012-09-15 LAB — APTT: Activated PTT: 36.9 secs — ABNORMAL HIGH (ref 23.6–35.9)

## 2012-09-15 LAB — MAGNESIUM: Magnesium: 1.4 mg/dL — ABNORMAL LOW

## 2012-09-15 LAB — CK TOTAL AND CKMB (NOT AT ARMC)
CK, Total: 63 U/L (ref 21–215)
CK-MB: 1 ng/mL (ref 0.5–3.6)

## 2012-09-15 LAB — AMMONIA: Ammonia, Plasma: 54 mcmol/L — ABNORMAL HIGH (ref 11–32)

## 2012-09-16 LAB — MAGNESIUM: Magnesium: 2.1 mg/dL

## 2012-09-16 LAB — CBC WITH DIFFERENTIAL/PLATELET
Basophil #: 0 10*3/uL (ref 0.0–0.1)
Basophil %: 1.1 %
Eosinophil %: 3.7 %
HGB: 11.3 g/dL — ABNORMAL LOW (ref 12.0–16.0)
Lymphocyte %: 37.1 %
MCH: 34.5 pg — ABNORMAL HIGH (ref 26.0–34.0)
Monocyte %: 2.4 %
Neutrophil %: 55.7 %
Platelet: 109 10*3/uL — ABNORMAL LOW (ref 150–440)
RBC: 3.26 10*6/uL — ABNORMAL LOW (ref 3.80–5.20)
RDW: 17.2 % — ABNORMAL HIGH (ref 11.5–14.5)
WBC: 1.6 10*3/uL — CL (ref 3.6–11.0)

## 2012-09-17 LAB — CBC WITH DIFFERENTIAL/PLATELET
HCT: 26.1 % — ABNORMAL LOW (ref 35.0–47.0)
MCV: 101 fL — ABNORMAL HIGH (ref 80–100)
Platelet: 64 10*3/uL — ABNORMAL LOW (ref 150–440)
RDW: 16.4 % — ABNORMAL HIGH (ref 11.5–14.5)
Segmented Neutrophils: 31 %
WBC: 1.7 10*3/uL — CL (ref 3.6–11.0)

## 2012-09-17 LAB — STOOL CULTURE

## 2012-09-17 LAB — URINE CULTURE

## 2012-09-17 LAB — RAPID HIV-1/2 QL/CONFIRM: HIV-1/2,Rapid Ql: NEGATIVE

## 2012-09-19 LAB — CBC WITH DIFFERENTIAL/PLATELET
Basophil %: 0.6 %
Eosinophil #: 0.2 10*3/uL (ref 0.0–0.7)
Eosinophil %: 4.8 %
HCT: 28 % — ABNORMAL LOW (ref 35.0–47.0)
Lymphocyte #: 0.9 10*3/uL — ABNORMAL LOW (ref 1.0–3.6)
Lymphocyte %: 22 %
MCHC: 33.5 g/dL (ref 32.0–36.0)
MCV: 100 fL (ref 80–100)
Monocyte #: 0.6 x10 3/mm (ref 0.2–0.9)
Neutrophil #: 2.2 10*3/uL (ref 1.4–6.5)
Neutrophil %: 56.4 %
RDW: 16.5 % — ABNORMAL HIGH (ref 11.5–14.5)

## 2012-09-19 LAB — BASIC METABOLIC PANEL
Anion Gap: 6 — ABNORMAL LOW (ref 7–16)
Calcium, Total: 8.2 mg/dL — ABNORMAL LOW (ref 8.5–10.1)
Chloride: 100 mmol/L (ref 98–107)
Glucose: 105 mg/dL — ABNORMAL HIGH (ref 65–99)
Osmolality: 281 (ref 275–301)
Potassium: 3.9 mmol/L (ref 3.5–5.1)

## 2012-09-20 LAB — BASIC METABOLIC PANEL
Anion Gap: 10 (ref 7–16)
Calcium, Total: 8.7 mg/dL (ref 8.5–10.1)
Chloride: 97 mmol/L — ABNORMAL LOW (ref 98–107)
Co2: 30 mmol/L (ref 21–32)
Creatinine: 1.28 mg/dL (ref 0.60–1.30)
EGFR (African American): 48 — ABNORMAL LOW
Osmolality: 279 (ref 275–301)

## 2012-09-20 LAB — CULTURE, BLOOD (SINGLE)

## 2012-09-20 LAB — MAGNESIUM: Magnesium: 1.7 mg/dL — ABNORMAL LOW

## 2012-09-20 LAB — CANCER CENTER HEMOGLOBIN: HGB: 10.9 g/dL — ABNORMAL LOW (ref 12.0–16.0)

## 2012-09-24 LAB — IRON AND TIBC
Iron Bind.Cap.(Total): 217 ug/dL — ABNORMAL LOW (ref 250–450)
Iron Saturation: 32 %
Iron: 70 ug/dL (ref 50–170)
Unbound Iron-Bind.Cap.: 147 ug/dL

## 2012-09-24 LAB — CBC CANCER CENTER
Basophil #: 0 x10 3/mm (ref 0.0–0.1)
HCT: 32.2 % — ABNORMAL LOW (ref 35.0–47.0)
Lymphocyte %: 12.1 %
MCH: 32.6 pg (ref 26.0–34.0)
Monocyte %: 10.6 %
Neutrophil #: 3.2 x10 3/mm (ref 1.4–6.5)
Neutrophil %: 70 %
Platelet: 114 x10 3/mm — ABNORMAL LOW (ref 150–440)
RBC: 3.14 10*6/uL — ABNORMAL LOW (ref 3.80–5.20)
RDW: 16.9 % — ABNORMAL HIGH (ref 11.5–14.5)

## 2012-09-24 LAB — FERRITIN: Ferritin (ARMC): 141 ng/mL (ref 8–388)

## 2012-09-29 LAB — CANCER CENTER HEMOGLOBIN: HGB: 10.4 g/dL — ABNORMAL LOW (ref 12.0–16.0)

## 2012-10-07 LAB — CANCER CENTER HEMOGLOBIN: HGB: 9.4 g/dL — ABNORMAL LOW (ref 12.0–16.0)

## 2012-10-08 ENCOUNTER — Ambulatory Visit: Payer: Self-pay | Admitting: Internal Medicine

## 2012-10-14 LAB — CANCER CENTER HEMOGLOBIN: HGB: 10.4 g/dL — ABNORMAL LOW

## 2012-10-25 ENCOUNTER — Emergency Department: Payer: Self-pay | Admitting: Emergency Medicine

## 2012-10-25 LAB — URINALYSIS, COMPLETE
Glucose,UR: NEGATIVE mg/dL (ref 0–75)
Ketone: NEGATIVE
Leukocyte Esterase: NEGATIVE
Nitrite: NEGATIVE
Protein: NEGATIVE
RBC,UR: <1 /HPF (ref 0–5)
Specific Gravity: 1.017 (ref 1.003–1.030)

## 2012-10-25 LAB — CBC
HGB: 11.2 g/dL — ABNORMAL LOW (ref 12.0–16.0)
MCV: 98 fL (ref 80–100)
Platelet: 159 10*3/uL (ref 150–440)
RBC: 3.37 10*6/uL — ABNORMAL LOW (ref 3.80–5.20)
RDW: 16.5 % — ABNORMAL HIGH (ref 11.5–14.5)
WBC: 9.1 10*3/uL (ref 3.6–11.0)

## 2012-10-25 LAB — COMPREHENSIVE METABOLIC PANEL
Albumin: 1.8 g/dL — ABNORMAL LOW (ref 3.4–5.0)
Alkaline Phosphatase: 166 U/L — ABNORMAL HIGH (ref 50–136)
Anion Gap: 6 — ABNORMAL LOW (ref 7–16)
Bilirubin,Total: 1.7 mg/dL — ABNORMAL HIGH (ref 0.2–1.0)
Co2: 23 mmol/L (ref 21–32)
Creatinine: 1.01 mg/dL (ref 0.60–1.30)
EGFR (Non-African Amer.): 56 — ABNORMAL LOW
Glucose: 175 mg/dL — ABNORMAL HIGH (ref 65–99)
Osmolality: 264 (ref 275–301)
Potassium: 3.4 mmol/L — ABNORMAL LOW (ref 3.5–5.1)
Sodium: 129 mmol/L — ABNORMAL LOW (ref 136–145)
Total Protein: 8.1 g/dL (ref 6.4–8.2)

## 2012-10-25 LAB — CLOSTRIDIUM DIFFICILE BY PCR

## 2012-10-26 LAB — WBCS, STOOL

## 2012-10-28 LAB — CANCER CENTER HEMOGLOBIN: HGB: 10.9 g/dL — ABNORMAL LOW (ref 12.0–16.0)

## 2012-10-28 LAB — RETICULOCYTES
Absolute Retic Count: 0.0682 10*6/uL (ref 0.023–0.096)
Reticulocyte: 2 % (ref 0.5–2.2)

## 2012-10-28 LAB — AMMONIA: Ammonia, Plasma: <25 mcmol/L (ref 11–32)

## 2012-10-28 LAB — FERRITIN: Ferritin (ARMC): 150 ng/mL (ref 8–388)

## 2012-11-07 ENCOUNTER — Ambulatory Visit: Payer: Self-pay | Admitting: Internal Medicine

## 2012-11-10 LAB — CANCER CENTER HEMOGLOBIN: HGB: 11 g/dL — ABNORMAL LOW (ref 12.0–16.0)

## 2012-11-19 LAB — CANCER CENTER HEMOGLOBIN: HGB: 10.8 g/dL — ABNORMAL LOW (ref 12.0–16.0)

## 2012-11-29 LAB — CANCER CENTER HEMOGLOBIN: HGB: 10.8 g/dL — ABNORMAL LOW (ref 12.0–16.0)

## 2012-12-08 ENCOUNTER — Ambulatory Visit: Payer: Self-pay | Admitting: Internal Medicine

## 2012-12-10 ENCOUNTER — Inpatient Hospital Stay: Payer: Self-pay | Admitting: Internal Medicine

## 2012-12-10 LAB — COMPREHENSIVE METABOLIC PANEL
Albumin: 1.6 g/dL — ABNORMAL LOW (ref 3.4–5.0)
Anion Gap: 6 — ABNORMAL LOW (ref 7–16)
BUN: 24 mg/dL — ABNORMAL HIGH (ref 7–18)
Bilirubin,Total: 1.6 mg/dL — ABNORMAL HIGH (ref 0.2–1.0)
Calcium, Total: 8 mg/dL — ABNORMAL LOW (ref 8.5–10.1)
Chloride: 106 mmol/L (ref 98–107)
Co2: 26 mmol/L (ref 21–32)
Creatinine: 1.32 mg/dL — ABNORMAL HIGH (ref 0.60–1.30)
EGFR (African American): 47 — ABNORMAL LOW
EGFR (Non-African Amer.): 40 — ABNORMAL LOW
Osmolality: 281 (ref 275–301)
Potassium: 4.6 mmol/L (ref 3.5–5.1)
Sodium: 138 mmol/L (ref 136–145)

## 2012-12-10 LAB — CBC WITH DIFFERENTIAL/PLATELET
Basophil #: 0 10*3/uL (ref 0.0–0.1)
Basophil %: 0.5 %
HGB: 9.3 g/dL — ABNORMAL LOW (ref 12.0–16.0)
Lymphocyte %: 13.9 %
Lymphocytes: 10 %
MCH: 32.4 pg (ref 26.0–34.0)
MCHC: 32.8 g/dL (ref 32.0–36.0)
Monocyte %: 7 %
Neutrophil %: 74.4 %
RDW: 18.2 % — ABNORMAL HIGH (ref 11.5–14.5)
Segmented Neutrophils: 79 %
Variant Lymphocyte - H1-Rlymph: 2 %

## 2012-12-10 LAB — URINALYSIS, COMPLETE
Hyaline Cast: 8
Ketone: NEGATIVE
Nitrite: NEGATIVE
Ph: 7 (ref 4.5–8.0)
Protein: NEGATIVE
RBC,UR: 5 /HPF (ref 0–5)
Specific Gravity: 1.015 (ref 1.003–1.030)

## 2012-12-10 LAB — LACTATE DEHYDROGENASE: LDH: 171 U/L (ref 81–246)

## 2012-12-11 LAB — BASIC METABOLIC PANEL
Anion Gap: 9 (ref 7–16)
BUN: 29 mg/dL — ABNORMAL HIGH (ref 7–18)
Chloride: 107 mmol/L (ref 98–107)
Co2: 24 mmol/L (ref 21–32)
EGFR (Non-African Amer.): 36 — ABNORMAL LOW
Glucose: 205 mg/dL — ABNORMAL HIGH (ref 65–99)
Potassium: 4.5 mmol/L (ref 3.5–5.1)
Sodium: 140 mmol/L (ref 136–145)

## 2012-12-11 LAB — CBC WITH DIFFERENTIAL/PLATELET
Basophil #: 0 10*3/uL (ref 0.0–0.1)
Basophil #: 0 10*3/uL (ref 0.0–0.1)
Basophil %: 0.4 %
Eosinophil #: 0 10*3/uL (ref 0.0–0.7)
Eosinophil #: 0 10*3/uL (ref 0.0–0.7)
Eosinophil %: 0.1 %
Eosinophil %: 0 %
HCT: 22.1 % — ABNORMAL LOW (ref 35.0–47.0)
HCT: 21.7 % — ABNORMAL LOW (ref 35.0–47.0)
HGB: 7.1 g/dL — ABNORMAL LOW (ref 12.0–16.0)
Lymphocyte %: 15 %
Lymphocyte %: 8.5 %
MCH: 32.5 pg (ref 26.0–34.0)
MCH: 33 pg (ref 26.0–34.0)
MCHC: 32.6 g/dL (ref 32.0–36.0)
MCHC: 32.9 g/dL (ref 32.0–36.0)
MCV: 100 fL (ref 80–100)
MCV: 101 fL — ABNORMAL HIGH (ref 80–100)
Monocyte #: 0.1 x10 3/mm — ABNORMAL LOW (ref 0.2–0.9)
Neutrophil #: 3.9 10*3/uL (ref 1.4–6.5)
Neutrophil %: 82.3 %
Neutrophil %: 87.9 %
Platelet: 4 10*3/uL — CL (ref 150–440)
RDW: 18.6 % — ABNORMAL HIGH (ref 11.5–14.5)
WBC: 4.7 10*3/uL (ref 3.6–11.0)
WBC: 11.7 10*3/uL — ABNORMAL HIGH (ref 3.6–11.0)

## 2012-12-11 LAB — OCCULT BLOOD X 1 CARD TO LAB, STOOL: Occult Blood, Feces: POSITIVE

## 2012-12-12 LAB — CBC WITH DIFFERENTIAL/PLATELET
Basophil #: 0 10*3/uL (ref 0.0–0.1)
Eosinophil %: 0 %
HCT: 20.2 % — ABNORMAL LOW (ref 35.0–47.0)
HGB: 6.5 g/dL — ABNORMAL LOW (ref 12.0–16.0)
Lymphocyte %: 6.7 %
MCH: 32.8 pg (ref 26.0–34.0)
MCHC: 32.3 g/dL (ref 32.0–36.0)
Monocyte #: 0.3 x10 3/mm (ref 0.2–0.9)
Monocyte %: 2.5 %
Neutrophil #: 10.8 10*3/uL — ABNORMAL HIGH (ref 1.4–6.5)
Neutrophil %: 90.7 %
RDW: 18.8 % — ABNORMAL HIGH (ref 11.5–14.5)

## 2012-12-12 LAB — PLATELET COUNT: Platelet: 13 10*3/uL — CL (ref 150–440)

## 2012-12-13 LAB — CBC WITH DIFFERENTIAL/PLATELET
Basophil %: 0 %
Eosinophil #: 0 10*3/uL (ref 0.0–0.7)
HCT: 20.2 % — ABNORMAL LOW (ref 35.0–47.0)
HGB: 6.6 g/dL — ABNORMAL LOW (ref 12.0–16.0)
Lymphocyte %: 7 %
MCH: 32.3 pg (ref 26.0–34.0)
Monocyte #: 0.4 x10 3/mm (ref 0.2–0.9)
Neutrophil %: 88.9 %
Platelet: 5 10*3/uL — CL (ref 150–440)
RBC: 2.04 10*6/uL — ABNORMAL LOW (ref 3.80–5.20)

## 2012-12-13 LAB — PLATELET COUNT: Platelet: 8 10*3/uL — CL (ref 150–440)

## 2012-12-14 LAB — CBC WITH DIFFERENTIAL/PLATELET
Basophil #: 0 10*3/uL (ref 0.0–0.1)
Basophil %: 0.1 %
Eosinophil #: 0 10*3/uL (ref 0.0–0.7)
Eosinophil #: 0 10*3/uL (ref 0.0–0.7)
HCT: 23.7 % — ABNORMAL LOW (ref 35.0–47.0)
HCT: 24.3 % — ABNORMAL LOW (ref 35.0–47.0)
HGB: 7.8 g/dL — ABNORMAL LOW (ref 12.0–16.0)
HGB: 8.1 g/dL — ABNORMAL LOW (ref 12.0–16.0)
Lymphocyte %: 6.4 %
MCH: 31.5 pg (ref 26.0–34.0)
MCH: 32 pg (ref 26.0–34.0)
MCHC: 33.4 g/dL (ref 32.0–36.0)
MCV: 95 fL (ref 80–100)
Monocyte #: 0.4 x10 3/mm (ref 0.2–0.9)
Monocyte %: 5 %
Neutrophil #: 7 10*3/uL — ABNORMAL HIGH (ref 1.4–6.5)
Neutrophil %: 88.5 %
Platelet: 11 10*3/uL — CL (ref 150–440)
RBC: 2.54 10*6/uL — ABNORMAL LOW (ref 3.80–5.20)
RDW: 18.3 % — ABNORMAL HIGH (ref 11.5–14.5)
WBC: 7.9 10*3/uL (ref 3.6–11.0)
WBC: 8.8 10*3/uL (ref 3.6–11.0)

## 2012-12-15 LAB — COMPREHENSIVE METABOLIC PANEL
Anion Gap: 11 (ref 7–16)
BUN: 62 mg/dL — ABNORMAL HIGH (ref 7–18)
Bilirubin,Total: 1.4 mg/dL — ABNORMAL HIGH (ref 0.2–1.0)
Chloride: 110 mmol/L — ABNORMAL HIGH (ref 98–107)
Creatinine: 1.98 mg/dL — ABNORMAL HIGH (ref 0.60–1.30)
EGFR (African American): 29 — ABNORMAL LOW
Glucose: 188 mg/dL — ABNORMAL HIGH (ref 65–99)
Osmolality: 306 (ref 275–301)
Potassium: 4.6 mmol/L (ref 3.5–5.1)
SGOT(AST): 44 U/L — ABNORMAL HIGH (ref 15–37)
SGPT (ALT): 34 U/L (ref 12–78)
Sodium: 142 mmol/L (ref 136–145)

## 2012-12-15 LAB — CBC WITH DIFFERENTIAL/PLATELET
Basophil #: 0 10*3/uL (ref 0.0–0.1)
Basophil %: 0 %
Eosinophil %: 0 %
HCT: 22.2 % — ABNORMAL LOW (ref 35.0–47.0)
Lymphocyte #: 0.4 10*3/uL — ABNORMAL LOW (ref 1.0–3.6)
Lymphocyte %: 4.6 %
MCH: 32.8 pg (ref 26.0–34.0)
MCHC: 34.4 g/dL (ref 32.0–36.0)
MCV: 96 fL (ref 80–100)
Platelet: 14 10*3/uL — CL (ref 150–440)
RBC: 2.33 10*6/uL — ABNORMAL LOW (ref 3.80–5.20)
RDW: 18.4 % — ABNORMAL HIGH (ref 11.5–14.5)
WBC: 8.6 10*3/uL (ref 3.6–11.0)

## 2012-12-15 LAB — AMMONIA: Ammonia, Plasma: 157 mcmol/L — ABNORMAL HIGH (ref 11–32)

## 2012-12-15 LAB — PROTIME-INR
INR: 1.8
Prothrombin Time: 21.4 secs — ABNORMAL HIGH (ref 11.5–14.7)

## 2012-12-16 LAB — BASIC METABOLIC PANEL
Anion Gap: 13 (ref 7–16)
EGFR (African American): 23 — ABNORMAL LOW
Glucose: 209 mg/dL — ABNORMAL HIGH (ref 65–99)
Potassium: 4.6 mmol/L (ref 3.5–5.1)
Sodium: 141 mmol/L (ref 136–145)

## 2012-12-16 LAB — CBC WITH DIFFERENTIAL/PLATELET
Basophil #: 0 10*3/uL (ref 0.0–0.1)
Eosinophil #: 0 10*3/uL (ref 0.0–0.7)
HCT: 23.7 % — ABNORMAL LOW (ref 35.0–47.0)
Lymphocyte #: 0.5 10*3/uL — ABNORMAL LOW (ref 1.0–3.6)
MCH: 33.3 pg (ref 26.0–34.0)
Monocyte %: 3.3 %
Neutrophil #: 10.9 10*3/uL — ABNORMAL HIGH (ref 1.4–6.5)
Platelet: 11 10*3/uL — CL (ref 150–440)
RBC: 2.43 10*6/uL — ABNORMAL LOW (ref 3.80–5.20)

## 2012-12-16 LAB — AMMONIA: Ammonia, Plasma: 76 mcmol/L — ABNORMAL HIGH (ref 11–32)

## 2013-01-08 ENCOUNTER — Ambulatory Visit: Payer: Self-pay

## 2013-02-05 DEATH — deceased

## 2013-05-04 IMAGING — CR DG SHOULDER 3+V*L*
1 series · 3 of 3 positions shown · non-contrast
Comparison: none

REASON FOR EXAM: pain
COMMENTS:

PROCEDURE:     DXR - DXR SHOULDER LEFT COMPLETE  - April 25, 2011  [DATE]
RESULT:     Images of the left shoulder demonstrate no findings fracture,
dislocation or radiopaque foreign body.

[Series 1: view not recorded · 0.17mm/px · 3 of 3 slices shown]
[im 1/3]
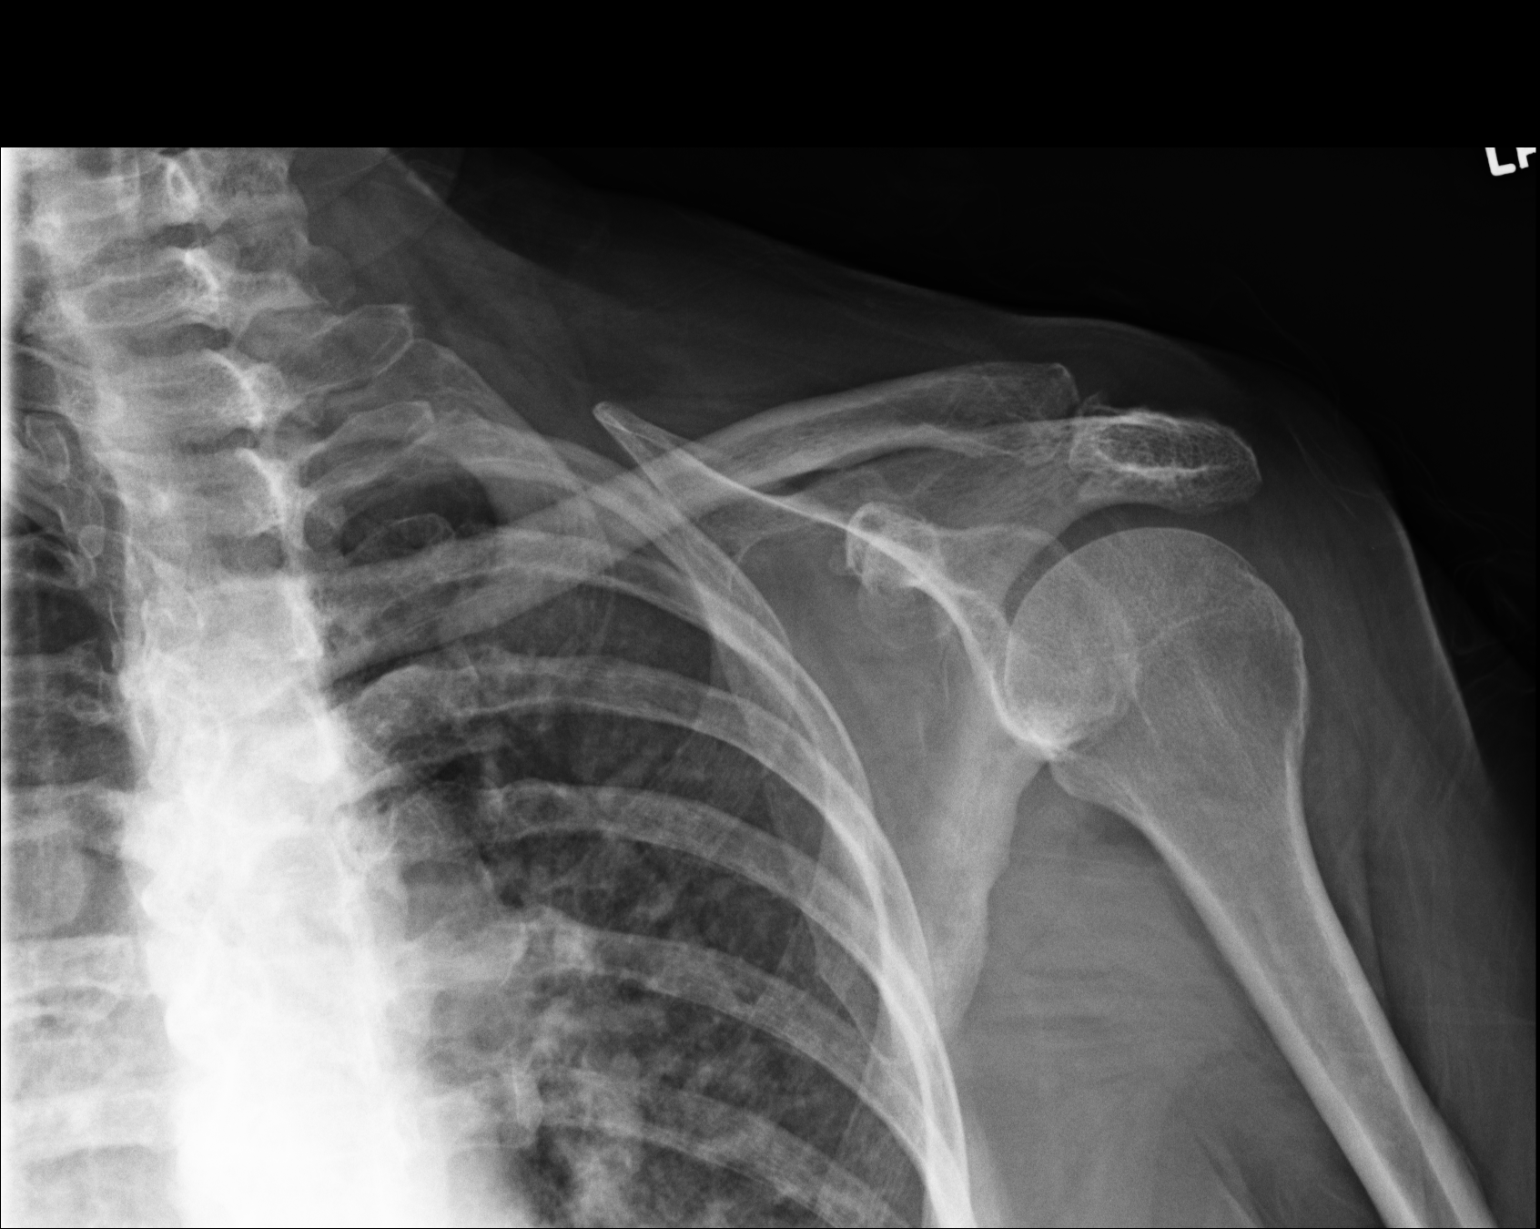
[im 2/3]
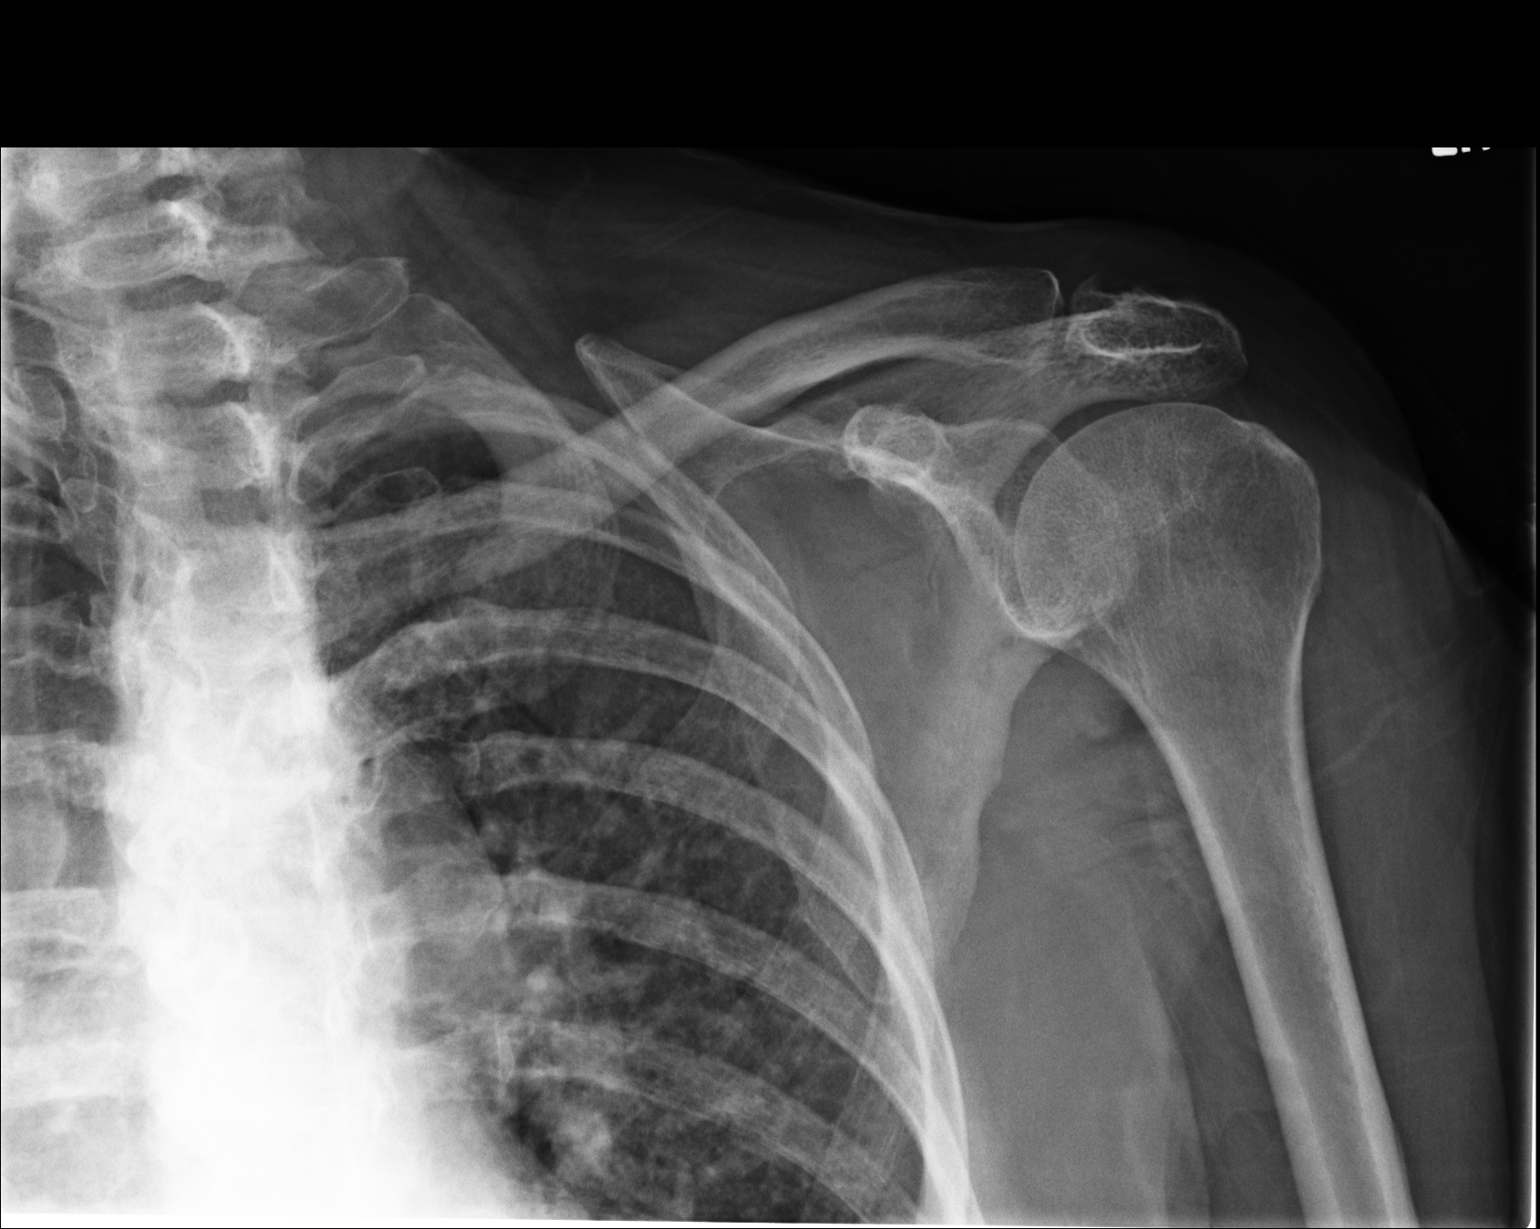
[im 3/3]
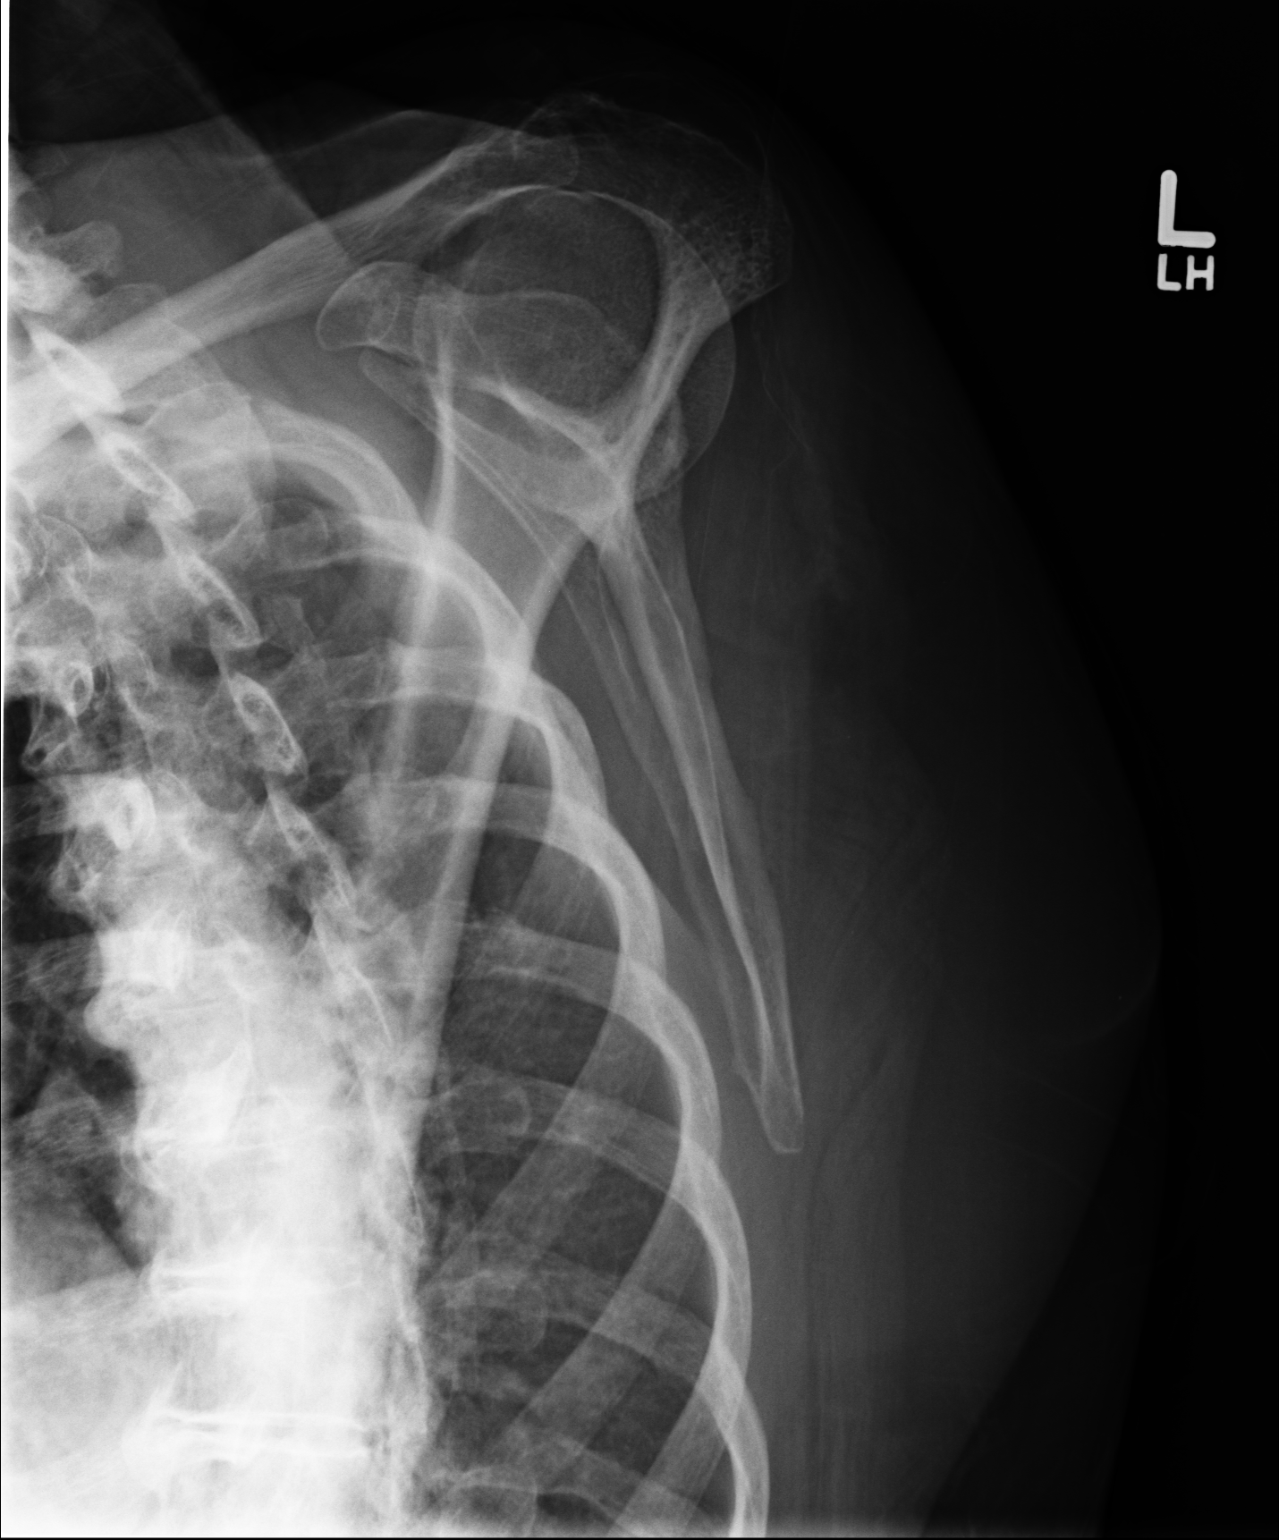

[3 of 3 positions shown; findings below may reference images not displayed]

IMPRESSION: Please see above.

## 2014-07-11 IMAGING — CT CT HEAD WITHOUT CONTRAST
4 series · 17 of 30 positions shown, 19 images · non-contrast
Comparison: none

REASON FOR EXAM: fall
COMMENTS:

[Series 2: without · axial · non-contrast · 0.38mm/px · z∈[+302,+402]mm · 5 of 32 slices shown]
[im 6/32  brain]
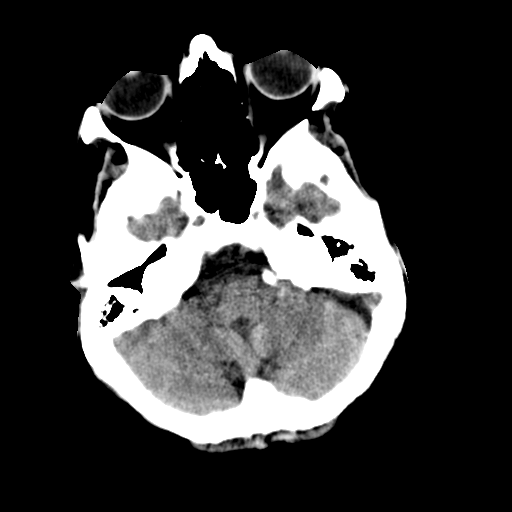
[im 11/32  brain]
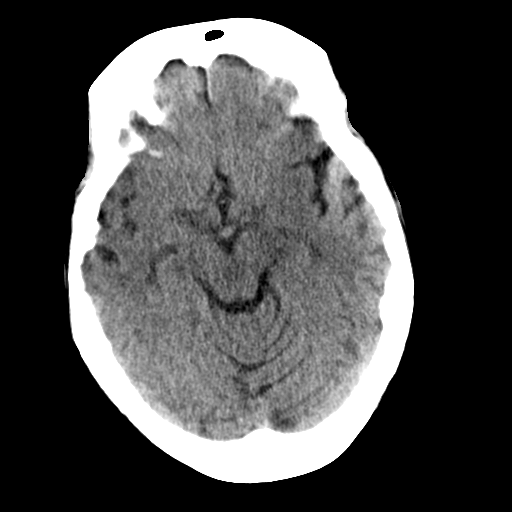
[im 16/32  brain]
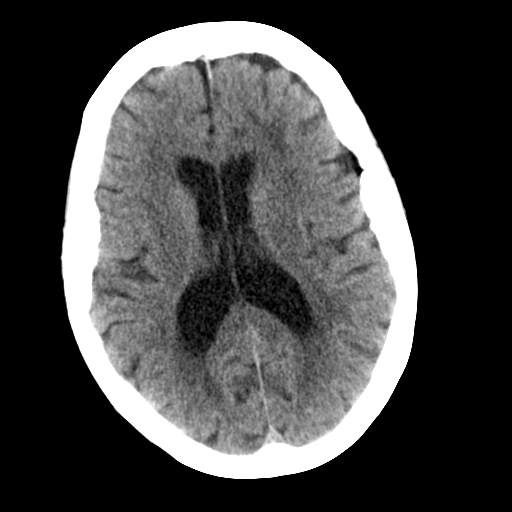
[im 21/32  brain]
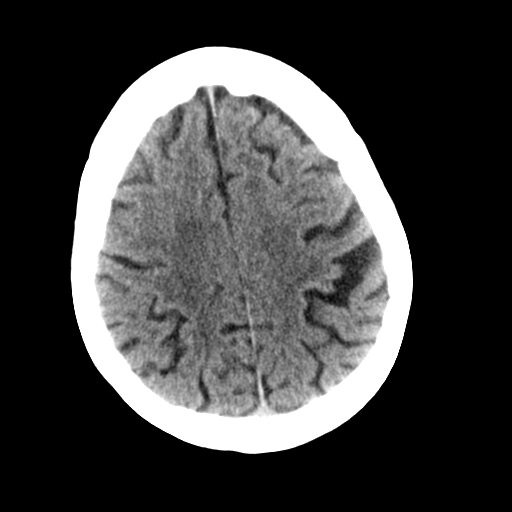
[im 26/32  brain]
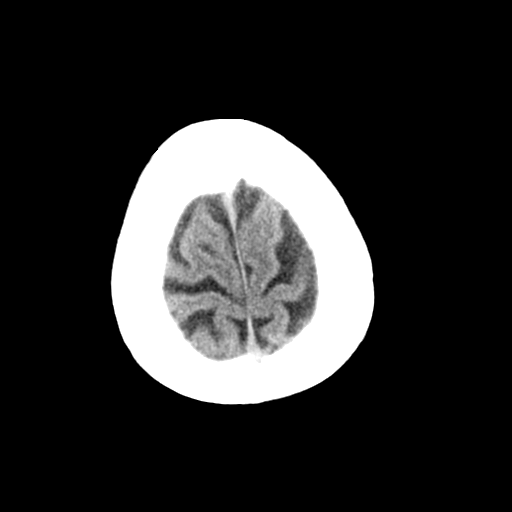

[Series 3: bone · axial · 0.38mm/px · 1 of 32 slices shown]
[im 5/32  bone]
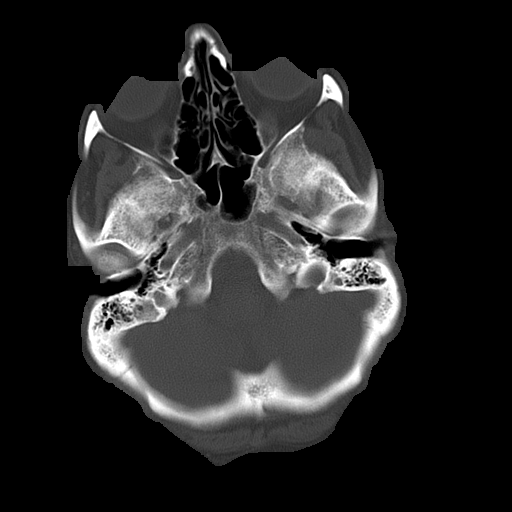

[Series 4: without 2 · axial · non-contrast · 0.38mm/px · z∈[+296,+386]mm · 5 of 28 slices shown, 7 images]
[im 5/28  brain]
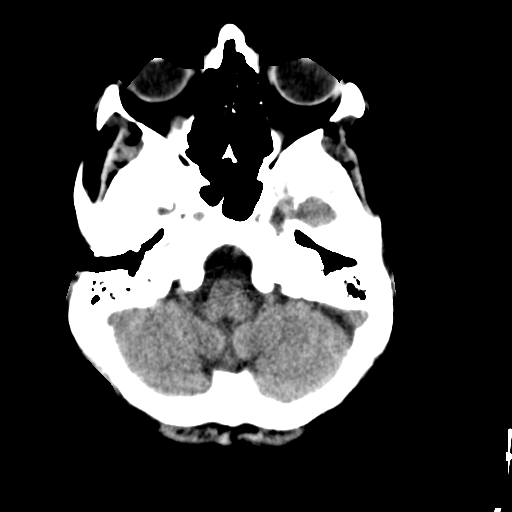
[im 5/28  bone]
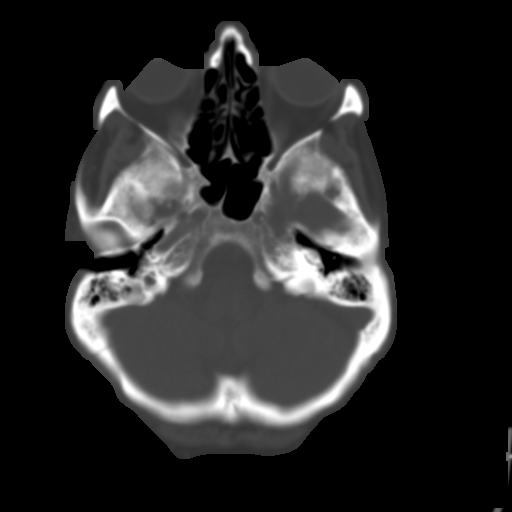
[im 10/28  brain]
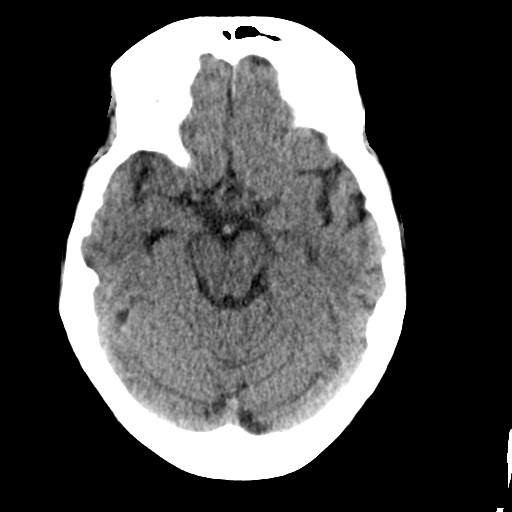
[im 14/28  brain]
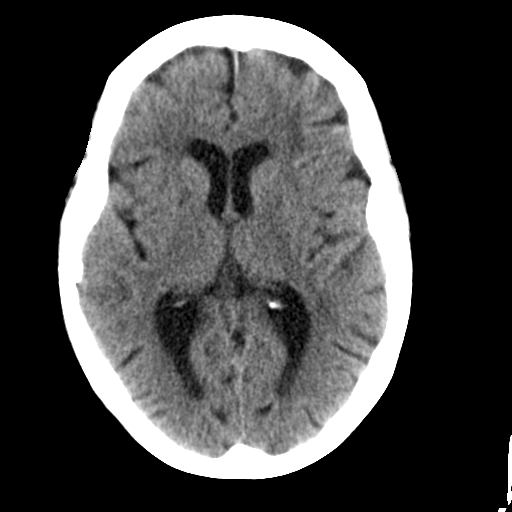
[im 19/28  brain]
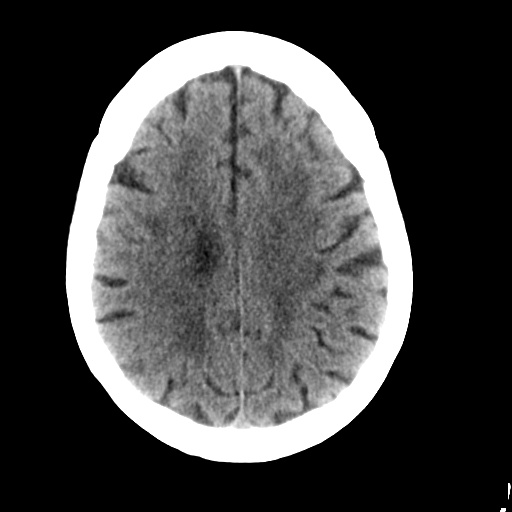
[im 23/28  brain]
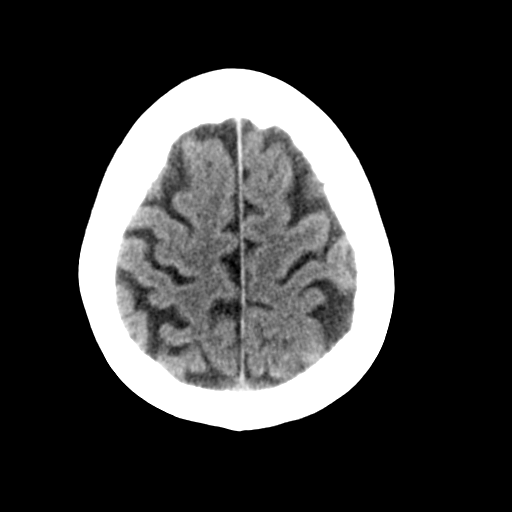
[im 23/28  bone]
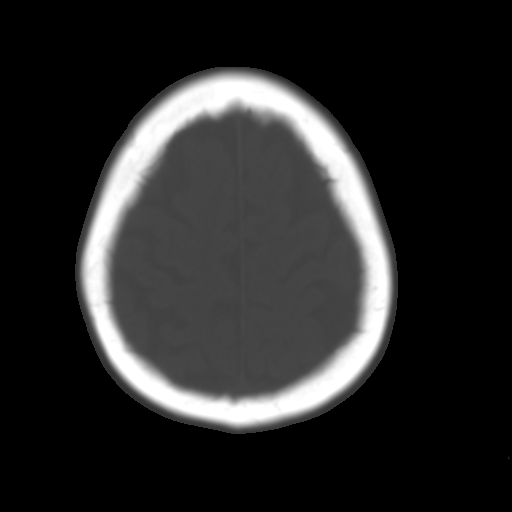

[Series 5: bone 2 · axial · 0.38mm/px · z∈[+303,+408]mm · 6 of 31 slices shown]
[im 5/31  bone]
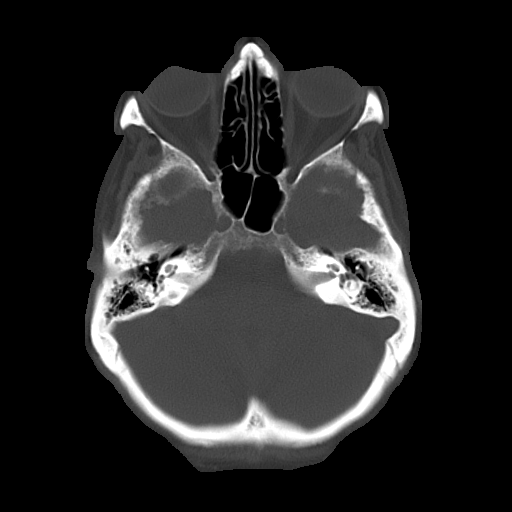
[im 9/31  bone]
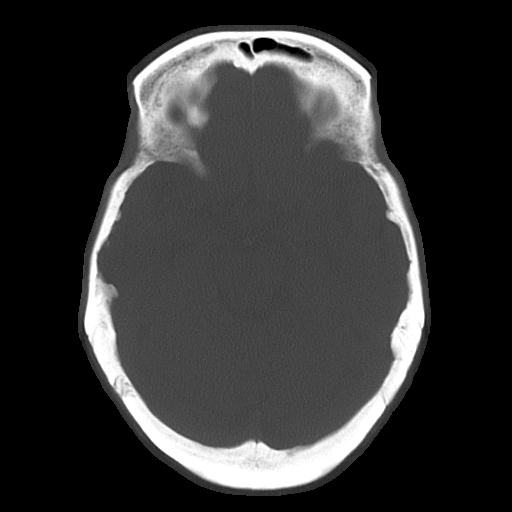
[im 13/31  bone]
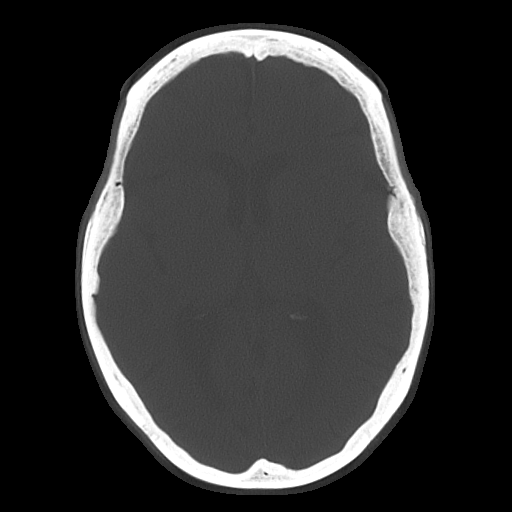
[im 18/31  bone]
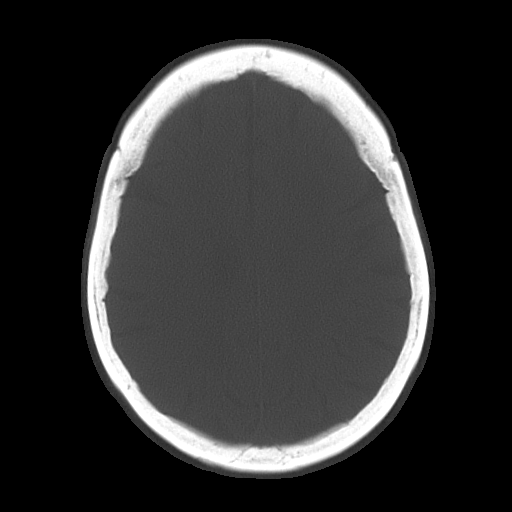
[im 22/31  bone]
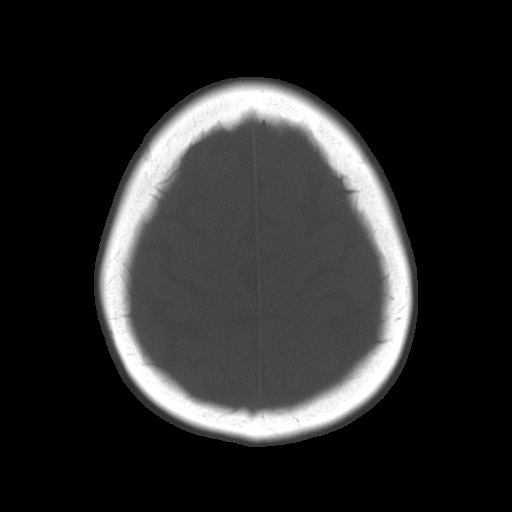
[im 26/31  bone]
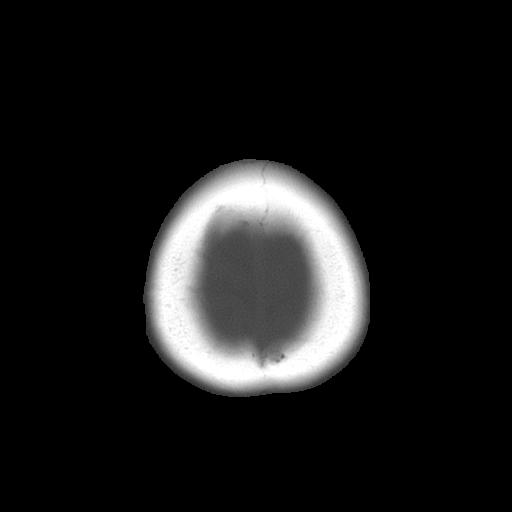

[17 of 30 positions shown; findings below may reference images not displayed]

PROCEDURE:     CT  - CT HEAD WITHOUT CONTRAST  - July 01, 2012  [DATE]

RESULT:     Noncontrast CT of the brain is compared to the previous study
dated 25 April, 2011.

Images demonstrate atrophy with low-attenuation diffusely in the
periventricular and subcortical white matter. Basal ganglia lacunar infarcts
are present bilaterally. There is no intracranial mass, hemorrhage or mass
effect. The included sinuses and mastoid air cells show normal appearing
aeration. The calvarium is intact.
IMPRESSION: 1. Atrophy with chronic small vessel ischemic disease. Stable appearance. No
acute intracranial abnormality evident.

Dictation Site: One

## 2015-03-27 NOTE — Consult Note (Signed)
Pt seen and examined. Pt had EGD with APC treatment to antrum for watermelon stomach/portal gastropathy. Admitted with increasing right abd pain yest. Min ascites. Pain improved. Pain likely from either pelvic mass or GB mass. Agree with discharge today on pain meds. May need to adjust lactulose dose so that she will have 3-4 soft BM's daily. Hgb stable. We will see her in office in 1-2 weeks. Will need to see Dr. Caryl NeverGitin also. Thanks.  Electronic Signatures: Lutricia Feilh, Malachi Kinzler (MD)  (Signed on 11-Sep-13 15:02)  Authored  Last Updated: 11-Sep-13 15:02 by Lutricia Feilh, Kelee Cunningham (MD)

## 2015-03-27 NOTE — H&P (Signed)
PATIENT NAME:  Sara Hampton, Sara Hampton MR#:  [id number] DATE OF BIRTH:  10/21/1941  DATE OF ADMISSION:  08/17/2012  PRIMARY CARE PHYSICIAN: Dr. Lorie PhenixNancy Hampton  ONCOLOGIST/HEMATOLOGIST: Dr. Lorre Hampton  GASTROENTEROLOGIST: Dr. Bluford Hampton   CHIEF COMPLAINT: Lower abdominal pain since yesterday.   HISTORY OF PRESENT ILLNESS: Ms. Sara Hampton is a 74 year old Caucasian female who has multiple medical problems including iron deficiency anemia, thrombocytopenia, splenomegaly, cirrhosis due to NASH, gallbladder malignancy and adnexal malignancy who comes to the Emergency Room complaining of lower abdominal pain since yesterday. Patient reports it to be cramping in nature. Denies any diarrhea or blood in stools. She received some pain medication in the Emergency Room. She also had a low-grade fever of 99. Her CT of the abdomen showed moderate ascites. Ultrasound paracentesis was not able to be done given minimal fluid. There is a question of SBP hence patient is started on IV Rocephin empirically. She is being admitted for further evaluation and management.   PAST MEDICAL HISTORY:  1. History of iron deficiency anemia attributed to GI bleed and watermelon stomach with most recent upper GI endoscopy done 09/04 by Dr. 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 Patient received her last IV iron on 09/06 during her routine follow-up visit at Dr. Lorre Hampton.  2. Splenomegaly. 3. Thrombocytopenia. 4. Pancytopenia, secondary to cirrhosis of liver which is secondary to NASH.  5. Monoclonal gammopathy.  6. 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 Patient is getting palliative treatment. The mass in malignant, inoperable.  7. Right lower extremity popliteal clot 08/07/2011. Patient has IVC filter placed.  8. Progressive adnexal mass growth. Again, it is not nonoperable [facility].     PAST SURGICAL HISTORY:  1. Recurrent EGD.  2. Hysterectomy.   SOCIAL HISTORY: Denies any history of smoking, alcohol or drug use. She lives alone.   FAMILY HISTORY: From old records, mother had cerebral hemorrhage. Father died of complications related to ganglion secondary to diabetes.   REVIEW OF SYSTEMS: CONSTITUTIONAL: Denies fever. Reports fatigue, weakness. EYES: No blurred or double vision. ENT: No tinnitus, ear pain, or hearing loss. RESPIRATORY: No cough, wheeze. CARDIOVASCULAR: No chest pain or palpitations. GASTROINTESTINAL: Positive for abdominal pain and nausea. No vomiting or diarrhea. No rectal bleed. GENITOURINARY: No dysuria, hematuria. ENDOCRINE: No polyuria or nocturia. HEMATOLOGY: Chronic anemia, easy bruisability and thrombocytopenia. SKIN: No acne, rash. MUSCULOSKELETAL: Positive for arthritis. NEUROLOGIC: No cerebrovascular accident, transient ischemic attack. PSYCH: No anxiety or depression. All other systems reviewed and negative.   CURRENT MEDICATIONS:  1. Spironolactone 50 mg p.o. daily.  2. Propranolol 20 mg b.i.d.  3. Citalopram 10 mg daily.  4. Allopurinol 100 mg at bedtime.  5. Lasix 40 mg b.i.d.  6. Lactulose 30 mL p.o. daily.  7. Rifaximin 550 mg 1 tablet b.i.d.  8. Omeprazole 20 mg b.i.d.  9. Folic acid 1 mg daily.  10. CVS iron tablets 27 mg 1 tablet b.i.d.   PHYSICAL EXAMINATION:  GENERAL: Patient is awake, alert, oriented x3, not in acute distress.   VITAL SIGNS: Temperature 99.5, pulse 63, blood pressure 108/57, sats 92% on room air.   HEENT: Atraumatic, normocephalic. Pupils are equal, round, and reactive to light and accommodation. Extraocular movements intact. Oral mucosa is dry.   NECK: Supple. No JVD. No carotid bruit.   RESPIRATORY: Clear to auscultation bilaterally. No rales, rhonchi, respiratory distress, or labored breathing.  CARDIOVASCULAR: Both the heart sounds are normal. Rate, rhythm is regular. PMI not lateralized. Chest  nontender.   EXTREMITIES: Good pedal pulses, good femoral pulses. There is 2+ pitting edema at the ankle joint.   ABDOMEN: Obese, soft. There is no distention. There is some tenderness present in the lower abdomen. No guarding, rigidity or organomegaly. Bowel sounds are normally present.   NEUROLOGIC: Grossly intact cranial nerves II through XII. No motor or sensory deficits.   PSYCH: Patient is awake, alert, oriented x3.   LABORATORY, DIAGNOSTIC AND RADIOLOGICAL DATA: Ultrasound-guided paracentesis: Minimal amount of ascites noted. Paracentesis could not be performed. CT of the abdomen and pelvis with IV contrast showed cirrhotic liver with moderate amount of ascites. Splenomegaly. 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 There is 2.7 cm cystic right ovarian mass, recommend further evaluation. White count 10.2, hemoglobin and hematocrit 11.1 and 33.0, platelet count 142, MCV 99, glucose 242, BUN 14, creatinine 1.02, bilirubin 1.3, alkaline phosphatase 188. LFTs normal. Albumin 2.1, lipase 107. Urinalysis negative for urinary tract infection. PT-INR 18.3 and 1.5.   ASSESSMENT AND PLAN: 74 year old Sara Hampton with history of cirrhosis of liver, thrombocytopenia, splenomegaly comes in with:  1. Lower abdominal pain. Patient has history of moderate ascites and cirrhosis. Differential is possible SBP. Will try to get diagnostic paracentesis which was not able to be done. Will  empirically start patient on Rocephin. Follow blood cultures. GI consultation pending. I discussed with Dr. Niel Hummer. Patient sees usually Dr. Bluford Kaufmann and will see if Dr. Bluford Kaufmann can see while patient is in-house. Will continue rest of the home medications. CT of the abdomen has been reviewed.  2. Cirrhosis of liver secondary to NASH. Continue spironolactone and Lasix for now. Patient's PT-INR is stable. Her LFTs were stable as well.  3. History of hepatic encephalopathy. Continue rifaximin and lactulose.  4. Iron deficiency anemia.  Patient sees Dr. Lorre Nick. Will continue p.o. iron pills along with folate, multivitamin and have her follow up as outpatient. Her hemoglobin and hematocrit are stable. She recently received IV iron on 09/06.  5. History of esophageal varices with portal hypertension. She is on propranolol, will continue that.  6. History of depression, on citalopram.  7. Deep vein thrombosis prophylaxis with SCDs and TEDs.  8. Hospital plan was discussed with patient. No family members present. Patient is a FULL CODE.    TIME SPENT: 45 minutes.   ____________________________ Wylie Hail Allena Katz, MD sap:cms D: 08/17/2012 15:03:06 ET T: 08/17/2012 15:46:01 ET JOB#: [id number]  cc: Destynee Stringfellow A. Allena Katz, MD, <Dictator> Leo Grosser, MD Knute Neu. Sara Nick, MD Ezzard Standing. Sara Kaufmann, MD Willow Ora MD ELECTRONICALLY SIGNED 08/23/2012 22:14

## 2015-03-27 NOTE — Consult Note (Signed)
PATIENT NAME:  Sara Hampton, Sara Hampton MR#:  [id number] DATE OF BIRTH:  09/15/1941  DATE OF CONSULTATION:  09/15/2012  REFERRING PHYSICIAN:  Wilfred Curtis, MD   CONSULTING PHYSICIAN:  Verdie Shire, MD/Adante Courington Ruthe Mannan, NP PRIMARY CARE PHYSICIAN: Jerrell Belfast, MD   REASON FOR CONSULTATION: Bright red blood per rectum.   HISTORY: Sara Hampton is a 74 year old Caucasian female who is well known to myself as well as Dr. Verdie Shire. 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 In reviewing Dr. Marylene Land most recent office note, the patient was diagnosed with adnexal masses last year. She has been under the care of Dr. Jacquelyne Balint. She was then seen at [facility] for evaluation of whether she is a surgical candidate, and decision was made for palliative care. According to the patient, the diagnosis of suspected gallbladder cancer was made this year. The patient was just recently admitted and discharged for anemia. Since her discharge from the hospital a month ago, she has been experiencing watery diarrhea, states it can be 5 times a day or even 8 times a day; and yesterday was the worst as she experienced that every time she turned around her bowels were moving. Yesterday evening though, at approximately 8:00 p.m., she had an episode of rectal bleeding. Given her known history of thrombocytopenia in correlation with her hepatic condition, the patient became alarmed, especially after there was a second occurrence of bleeding. The patient states that it was a moderate amount of blood, the second occurrence possibly passage of clots. From her most recent hospitalization, lactulose was held by Dr. Candace Cruise as she was experiencing diarrhea, and she was advised to continue on Xifaxan 500 mg, 1 tablet twice a day. Stopping the lactulose, unfortunately, really did not make a great difference in the number of bowel movements  she was having a day. No nocturnal bowel movements. She has had 4 to 5 bowel movements thus far today, and she did experience evidence of rectal bleeding at one occurrence. No nausea, no vomiting, no reflux. Appetite has been good. Weight has been stable. No abdominal pain but significant for soreness to abdomen. She does state that depression and overall spiritual state has been better since Dr. Venia Minks increased Celexa to 20 mg a day. Her most recent colonoscopy was performed by Dr. Verdie Shire on 01/21/2010 with the finding of diverticulosis. On 08/11/2012, EGD was done which revealed evidence of gastric antral vasculature with evidence of bleeding and grade 2 esophageal varices. The patient was experiencing some dysuria prior to this admission and has subsequently been diagnosed with a urinary tract infection, and in correlation with this lumbar spinal pain as well intermittently. At this time she appears to be resting well, no acute complaint. On 10/02, her hemoglobin was 11 on admission, and it dropped to 10.9, which subsequently rose as it is being followed serially. At 4:19 this morning, it was at 11.4, and at 12:04 this afternoon it had dropped to 9.7.   REVIEW OF SYSTEMS: CONSTITUTIONAL: Denies any fevers. No chills. No night sweats. EYES: No blurred vision, double vision. ENT: No hearing impairment, sore throat, dysphagia. CARDIOVASCULAR: No chest pain, shortness of breath, syncope. RESPIRATORY: No shortness of breath, coughing or hemoptysis. GASTROINTESTINAL: See history of present illness. GENITOURINARY: Significant for dysuria intermittently over the past week. No hematuria. MUSCULOSKELETAL: Intermittent lumbar spinal discomfort. Otherwise, no myalgias or arthralgias. SKIN: No evidence of jaundice  or cyanosis. Significant for superficial bruising. NEUROLOGIC: No weakness. No seizure activity. PSYCHIATRIC: Known history of mild depression which, again, she states is better.  See history of present  illness. ENDOCRINE: No polyuria, polydipsia, heat or cold intolerance.   PAST MEDICAL HISTORY:  1. Liver cirrhosis attributed to nonalcoholic fatty liver disease. 2. Portal hypertension.  3. Upper gastrointestinal bleed with watermelon stomach. Several upper endoscopies, last one was 08/11/2012 with evidence of grade 2 esophageal varices, hypertensive gastropathy, gastric antral vascular atelectasis with bleeding.  4. Splenomegaly secondary to liver cirrhosis.  5. Mild vitamin B deficiency.  6. Monoclonal gammopathy IgG spike June 2009.  7. History of cholelithiasis.  8. 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  9. Adnexal pelvic mass with suspected malignancy.  10. History of lower extremity deep vein thrombosis, status post IVC filter.  11. History of chronic anemia, followed by Dr. Inez Pilgrim.   PAST SURGICAL HISTORY: Hysterectomy.   SOCIAL HISTORY: No tobacco. No alcohol use. Widow. Resides by herself,  two sons.   FAMILY HISTORY: Father deceased with peripheral vascular disease, gangrene amputation, heart attack. Mom with cerebral hemorrhage.   MEDICATIONS:  1. Xifaxan 500 mg b.i.d. 2. Spironolactone 50 mg once a day. 3. Propranolol 20 mg b.i.d.   4. Omeprazole 20 mg b.i.d.   5. Magnesium oxide 400 mg once a day.  6. Potassium 10 mEq once a day.  7. Furosemide 40 mg b.i.d.  8. Folic acid 1 mg a day.  9. Iron 65 mg once a day.  10. Citalopram 20 mg once a day.  11. Allopurinol 100 mg once a day.   ALLERGIES: Penicillin causes hives. Aleve causes a rash and itching. Zithromax causes a rash.   PHYSICAL EXAMINATION:  VITAL SIGNS: Temperature 98, pulse 56, respirations 18, blood pressure 90/54 with a pulse oximetry of 90% on room air.   GENERAL: A well developed, overweight 74 year old Caucasian female. No acute distress noted, pleasant.   HEENT: Normocephalic, atraumatic. Pupils are equal, reactive to light. Conjunctivae are clear. Sclerae are  anicteric.   NECK: Supple. Trachea midline. No lymphadenopathy or thyromegaly.   PULMONARY: Symmetric rise and fall of chest. Clear to auscultation throughout.   CARDIOVASCULAR: Regular rhythm, S1, S2. No murmurs, no gallops.   ABDOMEN: Large, soft, nondistended. Bowel sounds in four quadrants. No bruits. No masses.   RECTAL: Deferred.   MUSCULOSKELETAL: Moving all four extremities. No contractures. No clubbing.   EXTREMITIES: No edema.   PSYCHIATRIC: Alert and oriented x4. Memory grossly intact. Appropriate affect and mood.   INTEGUMENTARY: Skin slightly pale, warm and dry. Evidence of spider hemangiomas.   NEUROLOGICAL: No gross neurological deficits.   LABORATORY, DIAGNOSTIC AND RADIOLOGICAL DATA: On 09/14/2012, glucose was 136, EGFR was 52, and anion gap was 5.0. Serum calcium was 8.3. Magnesium was 1.4 on 10/02 and on today's date remains at a level of 1.4. Ammonia level was less than 25 and has risen to 54 at this time. Hepatic panel: Albumin was 2.0 with an alkaline phosphatase of 199, otherwise within normal limits. CK total was 63.0 with a CK-MB of 1.0. Hemoglobin on admission was 10.9 with hematocrit 32.4, again it had risen to 11.4 and is currently at 9.7. Antibody screen is negative with ABO group plus Rh type is O+. PT is 19.4 with an INR of 1.6. PTT is 36.9. Urinalysis revealed +2 blood, protein 30 mg/dL, leukocytes +3, RBCs 25 per high-power field with WBC of 1523 per high-power field, +1 bacteria, epithelial  cells are 17 per high-power field, mucous is present. ABGs: pH is 7.47, pCO2 of 34, with a pO2 of 65 on room air. Lactic acid is elevated at 1.8. Chest x-ray revealed cardiomegaly with pulmonary vascular congestion, edema and small pleural effusion consistent with congestive heart failure, possibly some minimal left lung base pneumonia.   IMPRESSION:  1. Known history of cirrhosis secondary to nonalcoholic fatty liver disease, admitted for lower GI bleed, suspected  diverticular in nature given her known history of diverticulosis.  2. History of chronic diarrhea for at least the past four weeks.  3. Known history of thrombocytopenia.  4. Hypoxia. Chest x-ray today revealing element of congestive heart failure as well as possible pleural effusion.   PLAN: The patient's presentation was discussed with Dr. Verdie Shire. Recommendations are to proceed with stool study evaluation for possible infectious cause as the underlying etiological cause of chronic diarrhea. Order is placed. Suspected lower GI bleed is probably diverticular in nature. We will continue to monitor hemoglobin and hematocrit, transfuse as necessary. Would transfuse cautiously given her known history of esophageal varices. Recommend keeping hemoglobin at a level of 10.   These services provided by Payton Emerald, MS, APRN, [facility], FNP,  under collaborative agreement with Verdie Shire, MD.    ____________________________ Payton Emerald, NP dsh:cbb D: 09/15/2012 15:28:45 ET T: 09/15/2012 16:17:51 ET JOB#: [id number] Clarkson Rosselli S Rolanda Campa MD ELECTRONICALLY SIGNED 09/28/2012 16:21

## 2015-03-27 NOTE — Consult Note (Signed)
PATIENT NAME:  Sara Hampton, Sara Hampton MR#:  [id number] DATE OF BIRTH:  [birth year]  DATE OF CONSULTATION:  09/17/2012  REFERRING PHYSICIAN:   CONSULTING PHYSICIAN:  Knute Neu. Lorre Nick, MD  HISTORY OF PRESENT ILLNESS: Sara Hampton is a 74 year old patient well known to me who was admitted September 15, 2012, with rectal bleeding. The patient was evaluated by Gastroenterology and felt to have probably diverticular bleed but no other GI interventions were taken. The bleeding subsided. The patient's hemoglobin had maintained. The patient has a history of multiple underlying problems including chronic GI bleed, esophageal varices, and hypertensive gastropathy as well as cirrhosis, hypersplenism, chronic anemia, chronic thrombocytopenia, and pelvic and gallbladder mass representing malignancy for which no other interventions are available and which the patient is taking palliative care approach. In addition to the known problems, the patient has been discovered to have neutropenia with neutrophils down to 800 and dropping to 600. No fever but urinary tract infection was demonstrated.   PAST HISTORY: GI bleed, watermelon stomach with hypertensive gastropathy, cirrhosis, splenomegaly, chronic thrombocytopenia, coagulopathy, colon polyps, monoclonal gammopathy, cholelithiasis, gallbladder mass, adnexal mass, lower extremity edema in the lower extremities clots with deep vein thrombosis, vena caval filter, kidney stones, frequent transfusions, and frequent intravenous iron support.   ALLERGIES: Penicillin, Zithromax, and Aleve.   SOCIAL HISTORY: No alcohol or tobacco.   MEDICATIONS AT HOME: 1. Folic acid 1 mg daily.  2. Propranolol 20 mg twice a day. 3. Allopurinol 100 mg daily. 4. Rifaximin 550 mg twice a day .  5. Lasix 40 mg twice a day.  6. Omeprazole 20 mg twice a day.  7. Oral iron daily.  8. Xifaxan 550 mg 1 twice a day. 9. Spironolactone 50 mg daily.  10. Citalopram 10 mg daily.  11. Lactulose b.i.d. recently  but that was discontinued due to her recent diarrhea.   ADDITIONAL SYSTEM REVIEW: When I saw the patient, she was comfortable but was on oxygen, had some hypoxia with desaturation on exertion was discovered after hydration probably due to overhydration this hospitalization. She was not short of breath in bed at rest and had no headache, dizziness. No palpitations or retrosternal chest pain. No abdominal pain. No nausea, vomiting, or diarrhea. No focal weakness. No cough. No wheezing. No rash. She does have chronic bruising. No new  extremity edema.   PHYSICAL EXAMINATION:   GENERAL: Is alert and cooperative. Slight pallor. No jaundice.   HEENT: Sclerae clear.   MOUTH: No thrush.   LUNGS: Clear. Decreased air entry at the bases. No wheezing.   HEART: Regular.   ABDOMEN: Protuberant but not tense and nontender. No obvious mass or organomegaly.   EXTREMITIES: Has chronic edema, some bruising on the arms. No swollen or deformed joints.   NEUROLOGIC: Grossly nonfocal.   PSYCH: Normal. Alert, cooperative, oriented.   LABS ON ADMISSION: The creatinine was 1.07. Liver functions were unremarkable. Hemoglobin was 10.9, white count was 4000, platelets were 97,000, platelets fell to the 60,000s. The neutrophil count fell to 600. The urine was abnormal with positive leukocyte esterase and later showed Escherichia coli.   IMPRESSION AND PLAN: The patient with multiple problems. The acute bleed has stopped, looks like it is diverticular. It looks like some fluid overload clinically and by chest x-ray. That will be addressed with increased Lasix and with oxygen. The patient has neutropenia now in the absence of any fever or sepsis or new medications or acute viral illness. The patient probably just dropped the neutrophil count from either  progress hypersplenism or the effects of infection. The patient's workup that has been done include prior negative HIV and hepatitis C although it might be prudent to  recheck those. Currently not in acute distress, but he is on oxygen, was shown to desaturate on exertion. Also notable underlying malignancies with elevated tumor markers, which the patient is really only having palliative care with no either surgical or oncology interventions.   PLAN: I would recommend Levaquin be given at prophylactic dose 500 mg daily. Some empiric treatment for neutropenia would also be adequate to treat the urine infection. I would not perform bone marrow examination or change medications or any other interventions at this time. The patient could be discharged to keep the [facility] followup on Monday, 09/20/2012.  She was warned about the significance of fever while she has neutropenia, would be significant and would require her calling for attention and having hospitalization. I will continue to follow her including repeating a CBC at that visit.    ____________________________ Knute Neuobert G. Lorre NickGittin, MD rgg:vtd D: 09/17/2012 17:14:25 ET T: 09/18/2012 08:49:55 ET JOB#: [id number]  cc: Knute Neuobert G. Lorre NickGittin, MD, <Dictator> Marin RobertsOBERT G Etai Copado MD ELECTRONICALLY SIGNED 09/23/2012 23:47

## 2015-03-27 NOTE — Discharge Summary (Signed)
PATIENT NAME:  Sara Hampton, Sara Hampton MR#:  [id number] DATE OF BIRTH:  1941-06-26  DATE OF ADMISSION:  08/17/2012 DATE OF DISCHARGE:  08/18/2012  PRESENTING COMPLAINT: Lower abdominal pain.   DISCHARGE DIAGNOSES:  1. Abdominal pain more in the right flank and right lower quadrant likely from right known adnexal pelvic mass and gallbladder malignancy.  2. Known cirrhosis of liver.   CONDITION ON DISCHARGE: Fair.   DISCHARGE MEDICATIONS:  1. Folic acid 1 mg p.o. daily.  2. Propranolol 20 mg b.i.d.  3. Allopurinol 100 mg p.o. at bedtime.  4. Rifaximin 550 mg 1 tablet b.i.d.  5. Lactulose 30 mL p.o. daily.  6. Lasix 40 mg b.i.d.  7. Omeprazole 20 mg b.i.d.  8. Iron tablet 27 mg b.i.d.  9. Spironolactone 50 mg daily.  10. Citalopram 10 mg daily.  11. Norco 5/325 one p.o. t.i.d. p.r.n. for pain.   DIET: Low sodium diet.   FOLLOW-UP:  1. Follow-up with Dr. Elease HashimotoMaloney in 1 to 2 weeks.  2. Follow-up with Dr. Bluford Kaufmannh in 1 to 2 weeks.   CONSULTATION: GI consultation with Dr. Bluford Kaufmannh    LABORATORY, DIAGNOSTIC, AND RADIOLOGICAL DATA: White count 11.1, hemoglobin and hematocrit 10.3 and 30.5, platelet count 134. Chemistries within normal limits.   Ultrasound-guided paracentesis unable to do so given very low amount of fluid.   CT of the abdomen done showed cirrhotic liver with moderate amount of ascites. Splenomegaly. Gallbladder mass most consistent with adenocarcinoma. There is a 2.7 cm cystic known right ovarian mass.   Lipase 107. Urinalysis negative for urinary tract infection. PT-INR 18.3 and 1.5.   BRIEF SUMMARY OF HOSPITAL COURSE: Sara Hampton is a pleasant 74 year old Caucasian female with history of cirrhosis of liver, thrombocytopenia, splenomegaly, and known history of gallbladder carcinoma along with history of known adnexal pelvic mass in the right who came in with:  1. Lower abdominal pain more in the right lower quadrant which could more likely be due to pressure symptoms from her existing  right adnexal pelvic mass. Pain was relieved with IV morphine. She was changed to Percocet p.r.n. which the patient tolerated well. She was seen by Dr. Bluford Kaufmannh and was okay to go home. There were no signs or symptoms suggestive of SBP. Ultrasound of abdomen did not show enough ascitic fluid for removal. The patient remained afebrile. White count was stable.  2. Cirrhosis of liver secondary to NASH. Continue spironolactone and Lasix.  3. History of hepatic encephalopathy. Continue Rifaximin and lactulose.  4. Iron deficiency anemia. She sees Dr. Lorre NickGittin. Will continue p.o. iron pills along with folate and multivitamin. She received her last dose of IV iron on 08/13/2012.  5. History of esophageal varices with portal hypertension. On propranolol.  6. Depression. On citalopram.   Hospital stay otherwise remained stable.   CODE STATUS: The patient remained a FULL CODE.   TIME SPENT: 40 minutes.   ____________________________ Wylie HailSona A. Allena KatzPatel, MD sap:drc D: 08/19/2012 13:30:42 ET T: 08/19/2012 15:37:25 ET JOB#: [id number]  cc: Adonus Uselman A. Allena KatzPatel, MD, <Dictator>, Leo GrosserNancy J. Maloney, MD, Ezzard StandingPaul Y. Bluford Kaufmannh, MD, Knute Neuobert G. Lorre NickGittin, MD Willow OraSONA A Ranay Ketter MD ELECTRONICALLY SIGNED 08/23/2012 22:14

## 2015-03-27 NOTE — Consult Note (Signed)
Brief Consult Note: Diagnosis: Known history of cirrhosis secondary to nonalcoholic fatty liver disease.  Admitted for lower GI Bleed.  Suspected source diverticular.  Hypoxia with chest x-ray today showing element of CHF and possible pleural effusion.  Chronic diarrhea.   Consult note dictated.   Orders entered.   Discussed with Attending MD.   Comments: Patient's presentation discussed with Dr. Lutricia FeilPaul Oh.  Recommendations are to proceed with stool study evaluation to assess for possible infectious cause of chronic diarrhea.  Orders placed.  Suspect lower GI bleed is probably diverticular nature.  Will continue to monitor H/H.  Transfuse as necessary.  Transfuse cautiously given history of esophageal varices.  Recommend keeping hemoglobin at a level of 10.0.  Electronic Signatures: Rodman KeyHarrison, Dawn S (NP)  (Signed 09-Oct-13 15:28)  Authored: Brief Consult Note   Last Updated: 09-Oct-13 15:28 by Rodman KeyHarrison, Dawn S (NP)

## 2015-03-27 NOTE — Consult Note (Signed)
Chief Complaint:   Subjective/Chief Complaint No further bleeding or diarrhea. No abd pain. Hgb up today. SOB this AM after ambulation. On O2 now. Stool studies are neg.   VITAL SIGNS/ANCILLARY NOTES: **Vital Signs.:   10-Oct-13 05:30   Vital Signs Type Routine   Temperature Temperature (F) 99   Celsius 37.2   Temperature Source Oral   Pulse Pulse 73   Respirations Respirations 18   Systolic BP Systolic BP 118   Diastolic BP (mmHg) Diastolic BP (mmHg) 73   Mean BP 88   Pulse Ox % Pulse Ox % 96   Pulse Ox Activity Level  At rest   Oxygen Delivery 2L   Brief Assessment:   Cardiac Regular    Respiratory clear BS    Gastrointestinal Normal   Lab Results: Routine Chem:  10-Oct-13 04:13    Folic Acid, Serum 26.3 (Result(s) reported on 16 Sep 2012 at 09:43AM.)   Result Comment wbc/cbc - RESULTS VERIFIED BY REPEAT TESTING.  - READ-BACK PROCESS PERFORMED.  - NOTIFIED OF CRITICAL VALUE  - c/mercella turner 10/10/13at 0525.nbb  Result(s) reported on 16 Sep 2012 at 05:31AM.   Magnesium, Serum 2.1 (1.8-2.4 THERAPEUTIC RANGE: 4-7 mg/dL TOXIC: > 10 mg/dL  -----------------------)  Routine Hem:  10-Oct-13 04:13    WBC (CBC)  1.6   RBC (CBC)  3.26   Hemoglobin (CBC)  11.3   Hematocrit (CBC)  33.4   Platelet Count (CBC)  109   MCV  102   MCH  34.5   MCHC 33.7   RDW  17.2   Neutrophil % 55.7   Lymphocyte % 37.1   Monocyte % 2.4   Eosinophil % 3.7   Basophil % 1.1   Neutrophil #  0.9   Lymphocyte #  0.6   Monocyte #  0.0   Eosinophil # 0.1   Basophil # 0.0   Assessment/Plan:  Assessment/Plan:   Assessment LGI bleeding. Likely diverticular bleeding though hemorrhoidal bleeding from excess diarrhea also possible. Bleeding has stopped.    Plan Diet being advanced. If tolerates solids without bleeding, patient can be discharged by tomorrow assuming breathing also stabilizes. Thanks.   Electronic Signatures: Lutricia Feilh, Elianys Conry (MD)  (Signed 10-Oct-13 12:31)  Authored: Chief  Complaint, VITAL SIGNS/ANCILLARY NOTES, Brief Assessment, Lab Results, Assessment/Plan   Last Updated: 10-Oct-13 12:31 by Lutricia Feilh, Dorcas Melito (MD)

## 2015-03-27 NOTE — Consult Note (Signed)
Pt seen and examined. Known hx of cirrhosis and diverticulosis. See Dawn Harrison's notes. Rectal bleeding prob related to diverticulosis and coagulapathy. Moniter hgb. Hopefully, bleeding willl stop on own. Will follow. Thanks.  Electronic Signatures: Lutricia Feilh, Isham Smitherman (MD)  (Signed on 09-Oct-13 19:56)  Authored  Last Updated: 09-Oct-13 19:56 by Lutricia Feilh, Henchy Mccauley (MD)

## 2015-03-27 NOTE — Discharge Summary (Signed)
PATIENT NAME:  Sara Hampton, Sara Hampton MR#:  [id number] DATE OF BIRTH:  [birth year]  DATE OF ADMISSION:  07/01/2012 DATE OF DISCHARGE:  07/02/2012  ADMISSION/DISCHARGED DIAGNOSES:  1. Hepatic encephalopathy.  2. Liver cirrhosis secondary to nonalcoholic steatohepatitis (NASH) .   CONSULTS: None.   LABORATORY, DIAGNOSTIC AND RADIOLOGICAL DATA: White blood cells 4, hemoglobin 10, hematocrit 31, platelets 74, sodium 139, potassium 4.0, chloride 106, bicarbonate 25, BUN 20, creatinine is 1.08, glucose 198. Urinalysis shows no leukocyte esterase or nitrites. Ammonia level is 91. CT of the head showed no acute intracranial hemorrhage or cerebrovascular accident.   HOSPITAL COURSE: The patient is a 74 year old female with a history of liver cirrhosis who presented with confusion, noted to have elevated ammonia level. For further details, please refer to the History and Physical.   1. Hepatic encephalopathy: The patient stopped taking her lactulose. She says it was too sweet. She was confused apparently when she came to the hospital; however, today she is completely back to her baseline. Her ammonia level is elevated, however, this was collected before having two large bowel movements. She is completely clear. I  suspect if I repeated an ammonia level it would be pretty much normal. She said that she will continue her lactulose as prescribed.  2. NASH: She will continue Lasix and Aldactone.  3. Cirrhosis: Continue propranolol.  4. Depression: She  continue her Celexa.   DISCHARGE MEDICATIONS:  1. CVS iron 27 mg b.i.d.  2. Folic acid 1 tablet daily. 3. Spironolactone 50 mg daily.  4. Citalopram 10 mg daily.  5. Propranolol 20 mg b.i.d.  6. Omeprazole 20 mg b.i.d.  7. Lasix 40 mg b.i.d.   8. Allopurinol 100 mg at bedtime.  9. Lactulose 30 mL every b.i.d.  10. Rifaximin 550 mg b.i.d.   DISCHARGE DIET: Low sodium.   DISCHARGE ACTIVITY: As tolerated.   DISCHARGE FOLLOWUP: The patient can follow up with  Dr. Lorie PhenixNancy Maloney in one week.    TIME SPENT: 35 minutes.   ____________________________ Janyth ContesSital P. Juliene PinaMody, MD spm:cbb D: 07/02/2012 15:05:48 ET T: 07/02/2012 15:33:24 ET JOB#: [id number]  cc: Abdoulie Tierce P. Juliene PinaMody, MD, <Dictator> Leo GrosserNancy J. Maloney, MD Janyth ContesSITAL P Lynsey Ange MD ELECTRONICALLY SIGNED 07/09/2012 23:26

## 2015-03-27 NOTE — H&P (Signed)
PATIENT NAME:  Sara Hampton, Sara Hampton MR#:  [id number] DATE OF BIRTH:  November 29, 1941  DATE OF ADMISSION:  09/15/2012  PRIMARY CARE PHYSICIAN: Dr. Lorie Phenix REFERRING PHYSICIAN: Dr. Kathrynn Running  CHIEF COMPLAINT: Bright red blood per rectum.   HISTORY OF PRESENT ILLNESS: Ms. Klare is a 74 year old Caucasian female with history of liver cirrhosis, suspected gallbladder cancer and also adnexal pelvic mass suspect for malignancy. She also has history of esophageal varices grade 2 and portal hypertensive gastropathy. The patient was in her usual state of health until the last one month when she started to have watery diarrhea most of the days. She states that yesterday throughout the day she had diarrhea then towards the late afternoon she started to have red blood per rectum. At one time she saw some clots when she wiped, however, the rest of her rectal bleeding was red blood mixed with the stool. She denies any abdominal pain. No vomiting. No fever. No chills. She has a friend of her who is accompanying her and she states that she noticed some bluish discoloration of her fingers and her ears but this had subsided now. Patient denies having any chest pain. No shortness of breath. No cough. No hemoptysis. Evaluation here at the Emergency Department with rectal exam revealed brownish stool but heme positive. Also, there is evidence of urinary tract infection. Her hemoglobin almost the same compared to 10/02 when her hemoglobin was 11 and now it is 10.9. The patient was admitted for further evaluation and management.    REVIEW OF SYSTEMS: CONSTITUTIONAL: Denies any fever. No chills. No night sweats. EYES: No blurring of vision. No double vision. ENT: No hearing impairment. No sore throat. No dysphagia. CARDIOVASCULAR: No chest pain. No shortness of breath. No syncope. RESPIRATORY: No shortness of breath. No cough. No sputum production. No hemoptysis. GASTROINTESTINAL: No abdominal pain. No nausea. No vomiting but reports  diarrhea and rectal bleeding as above. GENITOURINARY: No dysuria or frequency of urination. MUSCULOSKELETAL: No joint pain or swelling. No muscular pain or swelling. INTEGUMENTARY: No skin rash. No ulcers. She has only skin ecchymotic lesions. NEUROLOGY: No focal weakness. No seizure activity. No headache. No disorientation. PSYCHIATRY: No anxiety. No depression. ENDOCRINE: No polyuria or polydipsia. No heat or cold intolerance.    PAST MEDICAL HISTORY:  1. Liver cirrhosis attributed to nonalcoholic fatty liver infiltration.  2. Portal hypertension. 3. Upper GI bleed from watermelon stomach. She had several upper endoscopies, the last one by Dr. Bluford Kaufmann done in May of this year and revealed grade 2 esophageal viruses, portal hypertensive gastropathy, gastric antral vascular ectasia with bleeding otherwise normal exam. Last colonoscopy was in February 2011 and that revealed diverticulosis of sigmoid colon.  4. Splenomegaly secondary to her liver cirrhosis. 5. Mild vitamin B12 deficiency. 6. 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  7. History of cholelithiasis and dilated common bile duct, also suspected gallbladder cancer. She was not candidate for surgery, not even laparoscopic because of the bleeding tendency.  8. She also has adnexal pelvic mass with suspected malignancy.  9. History of lower extremity deep vein thrombosis status post IVC filter.  10. History of anemia, followed up by Dr. Lorre Nick.   PAST SURGICAL HISTORY: Hysterectomy.   SOCIAL HABITS: Nonsmoker. No history of alcohol or drug abuse.   SOCIAL HISTORY: She is widowed, lives at home alone. She has 2 sons.  FAMILY HISTORY: Her father died from complications of peripheral vascular disease complicated by gangrene and amputation, then he had heart attack. Her mother died from cerebral hemorrhage.   ADMISSION  MEDICATIONS:  1. Rifaximin 550 mg twice a day. 2. Spironolactone 50 mg once a day. 3. Propranolol 20 mg twice a day. 4. Omeprazole 20 mg twice a day.  5. Magnesium oxide 400 mg once a day.  6. Potassium 10 mEq once a day. 7. Furosemide 40 mg twice a day. 8. Folic acid 1 mg a day. 9. Iron 65 mg once a day.  10. Citalopram 20 mg once a day. 11. Allopurinol 100 mg once a day.   ALLERGIES: Penicillin causing hives. Aleve causing rash and itching. Zithromax causing rash.   PHYSICAL EXAMINATION:  VITAL SIGNS: Blood pressure 119/65, respiratory rate 20, pulse 66, temperature 96.9, oxygen saturation 97%.   GENERAL APPEARANCE: Elderly female lying in bed in no acute distress.   HEAD AND NECK: No pallor. No icterus. No cyanosis.   ENT: Hearing was normal. Nasal mucosa, lips, tongue were normal.   EYES: Normal eyelids and conjunctivae. Pupils about 3 to 4 mm, could not see reactivity to light.   NECK: Supple. Trachea at midline. No thyromegaly. No cervical lymphadenopathy. No masses.   HEART: Normal S1, S2. No S3 or S4. No murmur. No gallop. No carotid bruits.   RESPIRATORY: Normal breathing pattern without use of accessory muscles. No rales. No wheezing.   ABDOMEN: Soft without tenderness. No hepatosplenomegaly. No masses. No hernias.   SKIN: Few scattered ecchymotic skin lesions. No ulcers. No subcutaneous nodules.   MUSCULOSKELETAL: No joint swelling. No clubbing.   NEUROLOGIC: Cranial nerves II through XII are intact. No focal motor deficit. She has mild asterixis of hands.   PSYCHIATRIC: Patient is alert, oriented x3. Mood and affect were normal.   LABORATORY, DIAGNOSTIC, AND RADIOLOGICAL DATA: EKG showed normal sinus rhythm at rate of 68 per minute. Nonspecific T wave abnormalities, otherwise unremarkable EKG. Chest x-ray showed mild cardiomegaly. There is mild haziness or silhouette of the left diaphragm and left cardiac border but no definite consolidation. Serum glucose 136,  BUN 13, creatinine 1.07, sodium 141, potassium 3.6, calcium 8.3, total protein 8, albumin 2, bilirubin 1, alkaline phosphatase 199, AST and ALT were normal. CBC showed white count 4000, hemoglobin 10.9. Her hemoglobin earlier this month was 11 that was on the 2nd. Hematocrit 32, platelet count 97. Her MCV, MCH are elevated 101 and 34 respectively. MCHC 33. Urinalysis was showing 1523 white blood cells, 25 RBCs, +1 bacteria and +3 leukocyte esterase. Arterial blood gas showed pH 7.47, pCO2 34, pO2 65. Stool for C. difficile on 10/02 showed negative findings.   ASSESSMENT:  1. Bright red blood per rectum likely secondary to diverticular bleed, however, upper GI bleed with rapid transit is another possibility.  2. Persistent diarrhea x1 month under investigation.  3. Anemia secondary to multifactorial including iron deficiency anemia, B12 deficiency and anemia of chronic diseases. I do not see any significant drop in her hemoglobin compared to her hemoglobin a week ago. 4. Urinary tract infection.  5. Liver cirrhosis secondary to NASH.  6. Portal hypertension.  7. 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  8. Splenomegaly.  9. History of recurrent upper GI bleed.  10. Portal gastropathy.  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 12. Grade 2 esophageal varices at the lower third.  13. Diverticulosis. 14. Suspected gallbladder and pelvic adnexal mass malignancy, nonoperable.  15. History of lower extremity deep venous thrombosis  status post IVC insertion. 16. Depression.  17. DNR status.   PLAN: Will admit the patient to the medical floor. Follow up on hemoglobin q.8 hours. GI consultation. Urine for culture and sensitivity. Stool will be sent for culture as well. I will also check Ova, parasite and Giardia antigen. I noted mild respiratory alkalosis. There is also mild metabolic alkalosis. I cannot exclude possibility of small pulmonary embolus, however, the patient is not a candidate for any form of  anticoagulation given her active GI bleed. She is already status post inferior vena cava filter insertion. Will monitor her condition closely. The patient indicates to me that she has a living will and she had left a copy in file at this hospital and that her CODE STATUS is DO NOT RESUSCITATE.   TIME SPENT: Time spent evaluating this patient and reviewing her medical records took more than one hour.   ____________________________ Carney CornersAmir M. Rudene Rearwish, MD amd:cms D: 09/15/2012 04:50:07 ET T: 09/15/2012 07:13:09 ET JOB#: [id number]  cc: Carney CornersAmir M. Rudene Rearwish, MD, <Dictator> Leo GrosserNancy J. Maloney, MD Karolee OhsAMIR Dala DockM Rick Carruthers MD ELECTRONICALLY SIGNED 09/15/2012 22:32

## 2015-03-27 NOTE — Discharge Summary (Signed)
PATIENT NAME:  Sara Hampton, Sara Hampton MR#:  [id number] DATE OF BIRTH:  [birth year]  DATE OF ADMISSION:  09/15/2012 DATE OF DISCHARGE:  09/19/2012  ADMITTING DIAGNOSIS: Bright red blood per rectum.   DISCHARGE DIAGNOSES:  1. Bright blood per rectum felt to be due to possible diverticular bleed, status post evaluation by gastroenterology with no further bleeding. Other differential includes possible hemorrhoidal bleed as well.  2. Acute blood loss anemia due to lower gastrointestinal bleed.  3. 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  4. Diarrhea, possible viral gastroenteritis, now resolved. Stool culture is negative.  5. Acute respiratory failure due to volume overload, resolved with intravenous Lasix.  6. Intermittent hypotension, likely related to her liver cirrhosis.  7. Pancytopenia felt to be related to her liver cirrhosis, splenomegaly, was seen by Dr. Lorre NickGittin. Patient's white blood cell count, hemoglobin and platelet count were all trending down and then now her white blood cell count is normal today, her platelets are going up. She has an appointment to see Dr. Lorre NickGittin on the 14th which she will keep.  8. Liver cirrhosis attributed to nonalcoholic fatty liver.  9. Portal hypertension.  10. History of upper gastrointestinal bleed from watermelon stomach. She has had several upper endoscopies, last one done by Dr. Bluford Kaufmannh in May revealed grade 2 esophageal varices, hypertension gastropathy.  11. Splenomegaly secondary to liver cirrhosis.  12. Mild vitamin B12 deficiency.  13. 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 14. History of cholelithiasis and dilated common bile duct, also suspected gallbladder cancer. Patient felt not to be a surgical candidate.  15. History of adnexal pelvic mass with suspected malignancy.  16. History of lower extremity deep vein thrombosis status  post inferior vena cava filter.  17. Chronic anemia, is followed by Dr. Lorre NickGittin.  18. Status post hysterectomy.  19. Urinary tract infection.   LABORATORY, DIAGNOSTIC AND RADIOLOGICAL DATA: EKG showed normal sinus rhythm, nonspecific T wave changes. Chest x-ray showed mild cardiomegaly. Serum glucose 136, BUN 13, creatinine 1.07, sodium 141, potassium 3.6, calcium 8.3, total protein 8, albumin 2, bilirubin total 1, alkaline phosphatase 199, AST and ALT were normal. CBC showed WBC count 4000, hemoglobin 10.9, platelet count 97. Urinalysis showing 1523 WBCs, 1+ bacteria, 3+ leukocytes. Urine culture showed greater than 100,000 Escherichia coli, pansensitive. Blood culture no growth at 48 hours. Stool for Giardia was negative. Stool cultures for some salmonella, Shigella no growth. B12 level 1507. Most recent WBC today 4.0, hemoglobin 9.4, platelet count 74.   CONSULTANTS DURING HOSPITALIZATION:  1. Dr. Lorre NickGittin.  2. Dr. Bluford Kaufmannh.   HOSPITAL COURSE: Please refer to history and physical done by the admitting physician. Patient is a 74 year old white female with multiple medical problems who presented with bright red blood per rectum. She was admitted for likely lower GI bleed. Initially she was started on treatment with octreotide due to her history of esophageal varices. Patient was also started on Protonix. Her hemoglobin was followed. Patient's bright red bleeding stopped on its own. She was seen in consultation by Dr. Bluford Kaufmannh who felt that this could be diverticular bleed. It was not felt to be due to upper GI bleed. Patient was monitored. Her hemoglobin did trend down from 10.9, now is stable around 9.4. Patient has also had platelet count that dropped from 97 to as low as 64 on the 11th, now her platelet count is  trending upwards. Patient also had leukopenia during hospitalization with her WBC going down as low as 1.6, now is up to 4.0. She was seen in consultation by hematology. She has an appointment to follow with  Dr. Lorre Nick this coming 14th, which she will keep. At that time will have a repeat CBC. She was also noticed to have a urinary tract infection which has been treated with Levaquin. Patient also during the hospitalization because she was resuscitated with fluid developed shortness of breath, swelling in her legs and was thought to have acute volume overload which was treated with IV Lasix with good results. Patient as doing much better currently and is stable for discharge home.   DISCHARGE MEDICATIONS:  1. Folic acid 1 mg daily.  2. Spironolactone 50 daily.  3. Allopurinol 100, 1 tab p.o. at bedtime.  4. Rifaximin 550, 1 tab p.o. b.i.d.  5. Lasix 40, 1 tab p.o. b.i.d.  6. Omeprazole 20, 1 tab p.o. b.i.d.  7. Iron 65 mg 1 tab p.o. daily.  8. Mag oxide 400 daily.  9. K-Tab 10 mEq 1 tab p.o. daily at bedtime. 10. Citalopram 10 mg 2 tablets daily.  11. Levaquin 500, 1 tab p.o. q.24 x3 more days.  12. At this time patient is told to stop propranolol due to her blood pressure being low. Once she is followed by her primary care provider they can resume propranolol if her blood pressure is stable.   HOME HEALTH:. Yes.   DIET: Low sodium, low fat, low cholesterol.   ACTIVITY: As tolerated.   FOLLOW UP: Follow up with Dr. Elease Hashimoto in 1 to 2 weeks. Follow up with Dr. Lorre Nick on 10/14. Patient to have a CBC check at the time of visit to Dr. Lorre Nick.  TIME SPENT: 35 minutes.   ____________________________ Lacie Scotts Allena Katz, MD shp:cms D: 09/19/2012 12:43:05 ET T: 09/20/2012 09:37:53 ET JOB#: [id number]  cc: Yarimar Lavis H. Allena Katz, MD, <Dictator> Leo Grosser, MD Knute Neu. Lorre Nick, MD Charise Carwin MD ELECTRONICALLY SIGNED 09/22/2012 11:56

## 2015-03-27 NOTE — Op Note (Signed)
PATIENT NAME:  Sara Hampton, Sara Hampton MR#:  [id number] DATE OF BIRTH:  [birth year]  DATE OF PROCEDURE:  06/22/2012  PREOPERATIVE DIAGNOSES: Deep venous thrombosis and pulmonary embolus.   POSTOPERATIVE DIAGNOSES: Deep venous thrombosis and pulmonary embolus.   PROCEDURE PERFORMED: Inferior venacavogram.   SURGEON: Renford DillsGregory G. Katelan Hirt, MD  SEDATION: Precedex. Continuous ECG, pulse oximetry and cardiopulmonary monitoring was performed throughout the entire procedure by the interventional radiology nurse. Total sedation time was 30 minutes.   ACCESS: Right IJ.   CONTRAST USED: Isovue 30 mL.   FLUORO TIME: Approximately half a minute.   INDICATIONS: Sara Hampton is a 74 year old woman who presents with history of deep venous thrombosis and pulmonary embolus status post IVC filter placement. She is undergoing evaluation for removal. Risks and benefits were reviewed. All questions answered. Patient agrees to proceed.   DESCRIPTION OF PROCEDURE: Patient is taken to special procedures, placed in supine position. After adequate sedation is achieved with a Precedex drip, right neck is prepped and draped in sterile fashion. Ultrasound is placed in a sterile sleeve. Ultrasound is utilized secondary to lack of appropriate landmarks. Jugular vein is identified. It is echolucent and compressible indicating patency. Image is recorded. Under real-time visualization Seldinger needle is inserted. J-wire is then advanced under fluoroscopy. Catheter is then positioned at the confluence of the iliac veins. Bolus injection of contrast is utilized to image the cava. Large thrombus is still identified within the filter itself. Measurements of the thrombus in the AP projection is 20 mm x 10 mm, in the LAO projection is 15 mm x 10 mm. Because this is a rather large thrombus continuation of the procedure with filter removal is not undertaken. Sheath is pulled, pressure is held.   There are no immediate complications.    ____________________________ Renford DillsGregory G. Daron Breeding, MD ggs:cms D: 06/22/2012 08:35:25 ET T: 06/22/2012 12:00:37 ET JOB#: [id number]  cc: Renford DillsGregory G. Cierra Rothgeb, MD, <Dictator> Leo GrosserNancy J. Maloney, MD Renford DillsGREGORY G Maty Zeisler MD ELECTRONICALLY SIGNED 06/29/2012 13:00

## 2015-03-27 NOTE — H&P (Signed)
PATIENT NAME:  Sara Hampton, Sara Hampton MR#:  [id number] DATE OF BIRTH:  Sep 23, 1941  DATE OF ADMISSION:  07/01/2012  REFERRING PHYSICIAN: Maricela Bo, MD  PRIMARY CARE PHYSICIAN: Lorie Phenix, MD  PRIMARY ONCOLOGIST: Benita Gutter, MD  GASTROENTEROLOGIST: Lutricia Feil, MD  CHIEF COMPLAINT: Altered mental status, confusion.   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 Lorre Nick. The patient apparently has been having increased confusion for the past several days. She has been doing some uncharacteristic things such as dressing and trying to go to the beauty shop yesterday which she has usually scheduled on Fridays. She has been more somnolent. She was seen by Dr. Lorre Nick this morning and the recommendation was to be transferred to the ER for further evaluation and management. The patient was noted to have somewhat elevated blood sugars and ammonia level was checked which was 96. Here she has had lactulose given already. Her CT of the head is negative for any acute events, but there is some atrophy and chronic small vessel ischemic disease. The ammonia level has come down and the patient's altered mental status has pretty much resolved. Her sister-in-law is in the room. Hospitalist service was contacted for further evaluation and management.   PAST MEDICAL HISTORY:  1. Diabetes. 2. Hypertension. 3. Cirrhosis secondary to NASH. 4. Esophageal varices.  5. History of GI bleed in the past.  6. History of right lower extremity deep venous thrombosis status post inferior vena cava filter, which was supposed to be taken out last week but was unable to.  7. History of iron deficiency anemia  attributed to gastrointestinal bleed and watermelon stomach per chart where she gets periodic transfusions.  8. History of colonic polyps. 9. History of chronic thrombocytopenia.  10. History of monoclonal gammopathy. 11. History of gallstones with dilated common bile duct with gallbladder mass. She had laparoscopic evaluation for gallbladder surgery, but bleeding was encountered therefore her surgery was not performed.  12. History of ascites. 13. History of progressive growth of adnexal mass per chart with rising tumor marker, CA-125.   PAST SURGICAL HISTORY:  1. Recurrent EGDs.  2. Hysterectomy.   SOCIAL HISTORY: No tobacco, alcohol or drug use. Lives by herself.   FAMILY HISTORY: Mom with stroke. Dad with gangrene secondary to diabetes with peripheral vascular disease and myocardial infarction.  MEDICATIONS: 1. Allopurinol 100 mg 1 tab daily.  2. Citalopram 10 mg daily. 3. Iron 2 times a day.  4. Folic acid 1 mg daily.  5. Furosemide 40 mg two times a day.  6. Lactulose 30 about two times a day, but the patient is noncompliant with this. 7. Omeprazole 20 mg twice a day. 8. Propranolol 20 mg twice a day. 9. Rifaximin 550 mg twice a day. 10. Spironolactone 50 mg daily.   REVIEW OF SYSTEMS: CONSTITUTIONAL: Denies any fever. Had some fatigue and somnolence recently, resolved now. No weight changes. EYES: No blurry vision or double vision. ENT: No tinnitus. Had runny nose but no sinus pain or drainage. No discharge. RESPIRATORY: Denies cough, wheezing, or hemoptysis. CARDIOVASCULAR: No chest pain or orthopnea. Has history of high blood pressure. GI: No nausea, vomiting, or diarrhea. No abdominal pain. History of cirrhosis and gastrointestinal bleed in the past. GENITOURINARY:  No dysuria or hematuria. ENDOCRINE: No polyuria, nocturia. HEME/LYMPH: No easy bruising. Has history of anemia and bleeding in the past. SKIN: No new rashes. MUSCULOSKELETAL: Denies arthritis. Has gout. NEUROLOGIC: No  numbness. PSYCHIATRIC: No anxiety or insomnia.   PHYSICAL EXAMINATION:   VITAL SIGNS: Temperature on arrival not recorded, however, pulse was 59, respiratory rate 18, blood pressure 114/59 and currently is 116/72, and oxygen saturation 99% on room air.   GENERAL: The patient is a pleasant, conversant Caucasian female sitting in bed eating, conversing.   HEENT: Normocephalic, atraumatic. Pupils are equal and reactive. Anicteric sclerae. Moist mucous membranes. Extraocular muscles intact.   NECK: Supple. No thyroid tenderness.   CARDIOVASCULAR: S1 and S2 regular rate and rhythm. No murmurs, rubs, or gallops.   LUNGS: Clear to auscultation without wheezing or rhonchi.   ABDOMEN: Soft, nontender, and nondistended. No significant ascites noted.   EXTREMITIES: 1+ edema on the right lower extremity, none on the left.   PSYCH: Awake, alert, and oriented x3, pleasant and conversant, cooperative.  NEURO: Cranial nerves II through XII grossly intact. Strength 5/5 in all extremities.   LABS/RADIOLOGIC STUDIES: Glucose 255, BUN 21, creatinine 1.08. Anion gap 6. Magnesium 1.6. Ammonia level in the morning was 96 and currently 66. Albumin 2.4, bilirubin 1.3, and alkaline phosphatase 152. ALT and AST within normal limits. Hemoglobin today is 10.4, hematocrit 31.7, and platelets are 98. INR 1.3.   CT of the head as above.   ASSESSMENT AND PLAN: We have a pleasant 74 year old Caucasian female with history of cirrhosis secondary to NASH, hypertension, diabetes, iron deficient anemia with periodic transfusions who is followed by Dr. Lorre NickGittin, as well as intraabdominal malignancy, who was referred from Dr. Paula ComptonGittin's office for altered mental status. At this point, it appears the patient is back to her baseline. This has improved with decrease in ammonia level with lactulose. At this point, she had a CT of the head which is negative, she has no significant leukocytosis, she has not experience any fevers as an  outpatient, and she has no dysuria or cough or shortness of breath. It appears that the patient was having hepatic encephalopathy secondary to noncompliance with her lactulose. We would increase her lactulose today and recheck an ammonia level tomorrow. In regards to her history of ascites and cirrhosis, we would continue her diuretics and rifaximin. Would also continue her propranolol. We would check her electrolytes tomorrow. Her anemia appears to be stable. Her hemoglobin was 9.6 on 06/28/2012 and today is 10.4. She should follow-up with her primary care physician, GI doctor, and Dr. Lorre NickGittin as an outpatient. We would also replete her magnesium here.   TOTAL TIME SPENT: 45 minutes.  ____________________________ Krystal EatonShayiq Cayenne Breault, MD sa:slb D: 07/01/2012 17:19:44 ET T: 07/01/2012 17:39:26 ET JOB#: [id number]  cc: Krystal EatonShayiq Allea Kassner, MD, <Dictator> Leo GrosserNancy J. Maloney, MD Knute Neuobert G. Lorre NickGittin, MD Krystal EatonSHAYIQ Katja Blue MD ELECTRONICALLY SIGNED 07/02/2012 19:14

## 2015-03-27 NOTE — Consult Note (Signed)
PATIENT NAME:  Sara Hampton, [name] MR#:  [id number] DATE OF BIRTH:  [birth year]  DATE OF CONSULTATION:  08/18/2012  REFERRING PHYSICIAN:   CONSULTING PHYSICIAN:  Ezzard StandingPaul Y. Bluford Kaufmannh, MD  REASON FOR REFERRAL: Abdominal pain.   DESCRIPTION: The patient is a 74 year old white female with a known history of cirrhosis due to NASH who also has inoperable gallbladder and adnexal malignancy who came to the Emergency Room complaining of increasing abdominal pain since September 9th. She just had an upper endoscopy on September 4th because of drop in hemoglobin where the antrum was cauterized with argon plasma coagulation for gastric vascular ectasias. She also has nonbleeding gastric esophageal varices. Initial CT scan showed moderate ascites, however, when an ultrasound was done there was only minimal fluid to be drained. There was a question of SBP and the patient was started on some IV Rocephin. On evaluation there was persistent adnexal as well as gallbladder mass. I was asked to see the patient to locate the source of the pain.   PAST MEDICAL HISTORY:  1. Iron deficiency anemia for which she gets periodic blood transfusion by Dr. Lorre NickGittin as well as cauterization of the antrum where the bleeding usually occurs from. 2. Splenomegaly.  3. Thrombocytopenia.  4. Monoclonal adenopathy.  5. Gallbladder mass as well as adnexal mass on the right side, growing, both felt to be inoperable.   PAST SURGICAL HISTORY: Hysterectomy.   SOCIAL HISTORY: Denies any alcohol or smoking.   FAMILY HISTORY: Notable for stroke and diabetes.   REVIEW OF SYSTEMS: Please refer to the initial history and physical. There are no changes.   This morning when I saw her, her pain has gotten better and she's able to eat okay.    MEDICATIONS AT HOME:  1. Aldactone. 2. Propranolol. 3. Allopurinol. 4. Lasix. 5. Lactulose. 6. Rifaximin. 7. Prilosec. 8. Folic acid. 9. Lactulose.   PHYSICAL EXAMINATION:   GENERAL: The patient is in no  acute distress at this point.   VITAL SIGNS: Vital signs are stable.   HEENT: Normocephalic, atraumatic head. Pupils are equally reactive. Throat was clear.   NECK: Supple.   CARDIAC: Regular rhythm and rate.   LUNGS: Clear bilaterally.   ABDOMEN: Obese abdomen. It was soft. There was mild tenderness more in the right lower area. There is no rebound or guarding.   EXTREMITIES: No edema.   NEUROLOGIC: Nonfocal.   SKIN: Negative.   LABORATORY, DIAGNOSTIC, AND RADIOLOGICAL DATA: Labs today white count 11.1, hemoglobin 10.3 which is an improvement from prior to endoscopy, sodium 138, potassium 3.6, chloride 106, CO2 23, BUN 18, creatinine 1.17, glucose 164. Liver enzymes showed bilirubin 1.4, alkaline phosphatase 188.   Again, CT scan showed a right ovarian cystic mass that measures 2.7 cm. Liver looked cirrhotic. 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   We suspect that the pain is from either the adnexal mass or the gallbladder mass which we can't really do anything about. I agree with discharge of the patient. She needs to resume all her medications at home. She will be given some pain medications. With the pain medication we may need to adjust the lactulose dose so she can have at least 3 to 4 soft bowel movements per day. She will follow-up with us in the office next week. She should also follow-up with Dr. Lorre NickGittin as well.   Thank you for the referral.   ____________________________ Ezzard StandingPaul Y. Bluford Kaufmannh, MD pyo:drc D: 08/19/2012 08:41:36 ET T: 08/19/2012 10:48:48 ET JOB#: [id number]  cc: Ezzard Standing. Bluford Kaufmann, MD, <Dictator> Ezzard Standing Peytan Andringa MD ELECTRONICALLY SIGNED 08/19/2012 15:29

## 2015-03-27 NOTE — Consult Note (Signed)
Brief Consult Note: Diagnosis: pancytopenia, cirrhosis uti, hypoxia.   Patient was seen by consultant.   Discussed with Attending MD.   Comments: SEE DICTATED NOTE  PATIENT WELL KNOWN TO ME MULTIPLE PROBLEMS, CURRENTLY HAD RECTAL BLEEDING RESOLVED. HAS KNOWN THROMBOCYTOPENIA DUE TO HYPERSPLENISM, HAS CHRONIC ANEMMIA FROM CHRONIC DISEASE AND MALIGNANCY AND BLOOD LOSS, HAS NEW DROP IN NEUTROPHILS, LIKLEY DUE TO RECENT INFECTION, POSSIBLE INCREASING HYPERSPLENISM.  NO ACUTE COMPLAINTS BUT HYPOXIC ON EXERTION.    PLAN  WOULD GIVE LEVAQUIN 500 MG PO DAILY, TX FOR UTI AND GOOD EMPIRIC TX FOR NEUTROPENIA. KEEP APPT ALREADY SCHEDULED FOR 10/14.  Electronic Signatures: Marin RobertsGittin, Ishika Chesterfield G (MD)  (Signed 11-Oct-13 14:36)  Authored: Brief Consult Note   Last Updated: 11-Oct-13 14:36 by Marin RobertsGittin, Hilberto Burzynski G (MD)

## 2015-03-30 NOTE — Consult Note (Signed)
PATIENT NAME:  Sara Hampton, Sara Hampton MR#:  [id number] DATE OF BIRTH:  1941-09-17  DATE OF CONSULTATION:  12/10/2012  REFERRING PHYSICIAN:  Gracelyn Nurse, MD CONSULTING PHYSICIAN:  Amonte Brookover R. Sherrlyn Hock, MD  REASON FOR CONSULTATION: Severe thrombocytopenia.   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 She was found to have platelet count down to 6000 today. She reported increased skin bruising in her arms, along with most recent bowel movement with scanty bright red blood but felt it was probably from hemorrhoids. Prior to this, CBC on 09/24/2012 had shown platelet count of 114 and on November 18, platelet count was 159. The patient reportedly had symptoms of UTI recently and states that she took a course of antibiotic, most likely sounds like Bactrim. Oral intake has been poor lately. She is also on folic acid 1 mg daily. Currently denies any fevers or chills. Denies any confusion or headaches.   PAST MEDICAL HISTORY AND PAST SURGICAL HISTORY: As in HPI above.   FAMILY HISTORY: Denies hematological disorders or malignancy.   SOCIAL HISTORY: Denies smoking or alcohol usage.   ALLERGIES: PENICILLIN, ALEVE, ZITHROMAX.   HOME MEDICATIONS: Spironolactone 50 mg daily, Prilosec 20 mg b.i.d., metformin 500 mg b.i.d., magnesium oxide 400 mg daily, propranolol 20 mg b.i.d., K-Dur 10 mEq daily, Lasix 40 mg b.i.d., folic acid 1 mg daily, citalopram 10 mg daily, ferrous sulfate 325 mg daily, allopurinol 100 mg daily. Also recently completed oral  antibiotic course, most likely Bactrim.   REVIEW OF SYSTEMS:  CONSTITUTIONAL: As in HPI. Feeling very weak lately. Oral intake poor. Denies any fever or chills. No night sweats.  HEENT: Denies any headaches, dizziness, epistaxis, ear or jaw pain.  CARDIAC: No angina at rest, palpitations, orthopnea or PND.  LUNGS: Denies any new cough, sputum, hemoptysis or chest pain.  GASTROINTESTINAL: Denies any nausea, vomiting or diarrhea. No bright red blood in stools, except as mentioned in HPI. No melena.  GENITOURINARY: No dysuria or hematuria.  SKIN: No new rashes or pruritus.  HEMATOLOGIC: Has easy skin bruising lately as described above. Otherwise as in HPI.  MUSCULOSKELETAL: Occasional arthritis pain. No new changes. No new bone pains.  NEUROLOGIC: Denies any focal weakness, seizures or loss of consciousness. No new paresthesias in extremities.  ENDOCRINE: No polyuria or polydipsia. Appetite is poor lately.   PHYSICAL EXAMINATION:  GENERAL: The patient is resting in bed, tired looking, otherwise alert and oriented to self, place, person and time and converses appropriately. No icterus. Pallor present.  VITAL SIGNS: Temperature 97.7, pulse 60, respiratory rate 20, blood pressure 116/67, 96% on room air.  HEENT: Normocephalic, atraumatic. Extraocular movements intact. Sclerae anicteric. No oral thrush or petechiae.  NECK: Negative for lymphadenopathy.  CARDIOVASCULAR: S1, S2, regular rate and rhythm.  LUNGS: Bilateral good air entry, decreased at bases. No rhonchi.  ABDOMEN: Soft, nontender. No hepatomegaly. Spleen is palpable. Bowel sounds present.  EXTREMITIES: Bilateral 1+ pitting edema. Bilateral bruising over forearms.  NEUROLOGIC: Limited exam. Cranial nerves seem intact. Moves all extremities spontaneously.  SKIN: No generalized rashes or petechiae. There is bruising over forearms.  MUSCULOSKELETAL: No  obvious joint deformity or swelling.   LABORATORY, DIAGNOSTIC, AND RADIOLOGICAL DATA:  LDH 171. Creatinine 1.32, potassium 4.6. Liver functions show bilirubin 1.6, alkaline phosphatase 171, AST 46, ALT 18, albumin low at 1.6. Urinalysis shows 2+ blood, 1+ leukocyte esterase, 14 WBCs and 1+ bacteria. CBC shows WBC 9500, hemoglobin 9.3, platelets 6. ANC 7000.   Recent CT abdomen and pelvis in September 2013 showed cirrhotic liver with moderate ascites, splenomegaly, 2.7 cm right ovarian cystic mass.   IMPRESSION AND RECOMMENDATIONS: This is a 74 year old female patient with history of multiple medical problems including nonalcoholic cirrhosis with portal hypertension and splenomegaly/hypersplenism with baseline low blood counts including thrombocytopenia, which has been mostly mild. Recently had oral course of antibiotic, most likely Bactrim, for a urinary tract infection and presents with progressive weakness along with severe thrombocytopenia with platelet count down to 6000. The patient has skin bruising, possible scanty blood in last bowel movement, but otherwise no major bleeding symptoms. Most likely etiology for worsening of chronic thrombocytopenia is recent Bactrim therapy, which can cause idiosyncratic/immune-mediated thrombocytopenia. Agree with ongoing plan with supportive platelet transfusion, empiric steroid with Solu-Medrol 60 mg intravenously every 12 hours. Peripheral smear does not show schistocytes. Continue folic acid 1 mg daily. Will check B12 level tomorrow morning with labs and then likely give her B12 injection. Avoid Bactrim usage in the future. Continue to monitor daily CBC and watch for bleeding symptoms. Will continue to follow. The patient was explained above, agreeable to this plan.   Thank you for the referral. Please feel free to contact me if additional questions.   ____________________________ Maren ReamerSandeep R. Sherrlyn HockPandit, MD srp:jm D: 12/10/2012 18:16:26 ET T: 12/10/2012 18:44:41 ET JOB#: [id number]  cc: Coraima Tibbs R. Sherrlyn HockPandit, MD, <Dictator> Wille CelesteSANDEEP R Jurgen Groeneveld  MD ELECTRONICALLY SIGNED 12/12/2012 11:49

## 2015-03-30 NOTE — Consult Note (Signed)
HEMATOLOGY followup note -  HPI: still feels tired. Denies new bleeding symptoms, stools had small amount of red blood yesterday but states it seems better today.  ROS: no fever or chills. No new cough or chest pain. Exam: A, O x 3, NAD. Pallor +             vitals - 97.9, 68, 20, 102/64, 96% on RA             HEENT -  no oral sores or petechiae             lungs - b/l good air entry             abd - soft, NT             skin - bruising over forearms unchanged Labs: Hb 7.2, WBC 4700, ANC 3900, platelets 4K, Cr 1.45, LDH normal.  Impression/Recommendations: 74 year old female patient with history of multiple medical problems including nonalcoholic cirrhosis with portal hypertension and splenomegaly/hypersplenism with baseline low blood counts including thrombocytopenia, admitted with severe worsening of thrombocytopenia most likely Bactrim induced, also has anemia. No new bleeding symptoms today, unclear if she had definite bloody stools, patient states it is better today, will request stool hemaoccult. Platelet count had improved to 44K last evening after platelet transfusion but has dropped down to 4K today. Agree with ongoing plan with supportive platelet transfusion, empiric steroid with Solu-Medrol. Peripheral smear does not show schistocytes. Continue folic acid 1 mg daily. B12 level has been sent today, will give vit B12 100 mcg SQ injection. Avoid Bactrim usage in the future. Continue to monitor daily CBC and watch for bleeding symptoms. Will continue to follow. The patient was explained above, she is agreeable to this plan.    Electronic Signatures: Izola PricePandit, Leander Tout Raj (MD)  (Signed on 04-Jan-14 22:58)  Authored  Last Updated: 04-Jan-14 22:58 by Izola PricePandit, Kashena Novitski Raj (MD)

## 2015-03-30 NOTE — Consult Note (Signed)
Chief Complaint:   Subjective/Chief Complaint NO ACUTE COMPLAINTS, NO DIARRHEA OR MELENA, MILD UPPER ABDO FULLNESS, NOT SOB   VITAL SIGNS/ANCILLARY NOTES: **Vital Signs.:   06-Jan-14 18:16   Vital Signs Type Pre-Blood   Temperature Temperature (F) 97.5   Celsius 36.3   Temperature Source oral   Pulse Pulse 59   Respirations Respirations 20   Systolic BP Systolic BP 104   Diastolic BP (mmHg) Diastolic BP (mmHg) 60   Mean BP 74   BP Source  if not from Vital Sign Device non-invasive   Pulse Ox % Pulse Ox % 97    23:10   Vital Signs Type 15 min Post Blood Start Time   Temperature Temperature (F) 97.5   Celsius 36.3   Temperature Source Oral   Pulse Pulse 71   Respirations Respirations 20   Systolic BP Systolic BP 104   Diastolic BP (mmHg) Diastolic BP (mmHg) 67   Mean BP 79   Pulse Ox % Pulse Ox % 95   Oxygen Delivery Room Air/ 21 %   Brief Assessment:   Cardiac Regular    Respiratory clear BS    Gastrointestinal details normal Nontender    Additional Physical Exam ABDO DISTENDED, NON TENDER, ALERT, COOPERATIVE, NEURO GROSSLY NON FOCAL, BRUISE UPPER ARM NEAR IV SITE,   Lab Results: Routine BB:  06-Jan-14 10:30    Platelets (Blood Component) Issued (Result(s) reported on 13 Dec 2012 at 02:32PM.)    12:59    Platelets (Blood Component) Issued (Result(s) reported on 13 Dec 2012 at 10:53PM.)   Crossmatch Unit 1 Ready   Crossmatch Unit 2 Ready (Result(s) reported on 13 Dec 2012 at 05:15PM.)  Routine Chem:  06-Jan-14 05:56    Result Comment LABS - This specimen was collected through an   - indwelling catheter or arterial line.  - A minimum of of blood was wasted prior    - to collecting the sample.  Interpret  - results with caution. PLATELET - RESULTS VERIFIED BY REPEAT TESTING.  - CRITICAL VALUE PREVIOUSLY NOTIFIED.  Result(s) reported on 13 Dec 2012 at 06:20AM.    13:18    Result Comment PLATELET COUNT - RESULTS VERIFIED BY REPEAT TESTING.  - C/CHRISTINA  [id number]  - NOTIFIED OF CRITICAL VALUE  - READ-BACK PROCESS PERFORMED.  Result(s) reported on 13 Dec 2012 at 01:56PM.  Routine Hem:  06-Jan-14 05:56    Platelet Count (CBC)  8    13:18    WBC (CBC) 10.5   RBC (CBC)  2.04   Hemoglobin (CBC)  6.6   Hematocrit (CBC)  20.2   Platelet Count (CBC)  5   MCV 99   MCH 32.3   MCHC 32.7   RDW  19.7   Neutrophil % 88.9   Lymphocyte % 7.0   Monocyte % 4.1   Eosinophil % 0.0   Basophil % 0.0   Neutrophil #  9.3   Lymphocyte #  0.7   Monocyte # 0.4   Eosinophil # 0.0   Basophil # 0.0   Assessment/Plan:  Assessment/Plan:   Assessment GI BLEED  THROMBOCYTOPENIA, SUPERIMPOSED ON HYPERSPLENISM IS NEWER ONSET SEVERE THROMBOYTOPENIA, ATTRIBUTED TO MARROW SUPPRESSION FROM BACTRIM, POSSIBLE ITP MECHANISM, PLUS BLEEDING/CONSUMPTION. PRIOR EVALUATED FOR AND R/O TTP SEE DR PANDITS CONSULT NOTE. NO SIGN OF ACTIVE GI BLEEDING THIS AFTERNOOD, STABLE WHEN SEEN EARLIR IN AM    Plan HAD 1 UNIT PLTS AND 1 UNIT PRBC, AWAITING SCOND UNIT OF EACH, F/U CBC IN AM  Electronic Signatures: Marin RobertsGittin, Robert G (MD)  (Signed 06-Jan-14 23:32)  Authored: Chief Complaint, VITAL SIGNS/ANCILLARY NOTES, Brief Assessment, Lab Results, Assessment/Plan   Last Updated: 06-Jan-14 23:32 by Marin RobertsGittin, Robert G (MD)

## 2015-03-30 NOTE — Consult Note (Signed)
Chief Complaint:   Subjective/Chief Complaint Very lethargic today. Cr going up. PLTC still low.   VITAL SIGNS/ANCILLARY NOTES: **Vital Signs.:   08-Jan-14 04:31   Vital Signs Type Routine   Temperature Temperature (F) 97.4   Celsius 36.3   Temperature Source Oral   Pulse Pulse 56   Respirations Respirations 20   Systolic BP Systolic BP 419   Diastolic BP (mmHg) Diastolic BP (mmHg) 69   Mean BP 84   Pulse Ox % Pulse Ox % 96   Pulse Ox Activity Level  At rest   Oxygen Delivery Room Air/ 21 %   Brief Assessment:   Cardiac Regular    Respiratory clear BS    Gastrointestinal Normal   Lab Results: Hepatic:  08-Jan-14 07:39    Bilirubin, Total  1.4   Alkaline Phosphatase 124   SGPT (ALT) 34   SGOT (AST)  44   Total Protein, Serum 6.6   Albumin, Serum  2.2  Routine Chem:  08-Jan-14 07:39    Glucose, Serum  188   BUN  62   Creatinine (comp)  1.98   Sodium, Serum 142   Potassium, Serum 4.6   Chloride, Serum  110   CO2, Serum 21   Calcium (Total), Serum  8.2   Osmolality (calc) 306   eGFR (African American)  29   eGFR (Non-African American)  25 (eGFR values <53mL/min/1.73 m2 may be an indication of chronic kidney disease (CKD). Calculated eGFR is useful in patients with stable renal function. The eGFR calculation will not be reliable in acutely ill patients when serum creatinine is changing rapidly. It is not useful in  patients on dialysis. The eGFR calculation may not be applicable to patients at the low and high extremes of body sizes, pregnant women, and vegetarians.)   Anion Gap 11   Result Comment labs - This specimen was collected through an   - indwelling catheter or arterial line.  - A minimum of 61mls of blood was wasted prior    - to collecting the sample.  Interpret  - results with caution.  Result(s) reported on 15 Dec 2012 at 09:41AM.   Ammonia, Plasma  157 (Result(s) reported on 15 Dec 2012 at 09:58AM.)  Routine Coag:  08-Jan-14 07:39     Prothrombin  21.4   INR 1.8 (INR reference interval applies to patients on anticoagulant therapy. A single INR therapeutic range for coumarins is not optimal for all indications; however, the suggested range for most indications is 2.0 - 3.0. Exceptions to the INR Reference Range may include: Prosthetic heart valves, acute myocardial infarction, prevention of myocardial infarction, and combinations of aspirin and anticoagulant. The need for a higher or lower target INR must be assessed individually. Reference: The Pharmacology and Management of the Vitamin K  antagonists: the seventh ACCP Conference on Antithrombotic and Thrombolytic Therapy. FXTKW.4097 Sept:126 (3suppl): [id number]. A HCT value >55% may artifactually increase the PT.  In one study,  the increase was an average of 25%. Reference:  "Effect on Routine and Special Coagulation Testing Values of Citrate Anticoagulant Adjustment in Patients with High HCT Values." American Journal of Clinical Pathology 2006;126:400-405.)   Assessment/Plan:  Assessment/Plan:   Assessment GI bleeding from low platelets. Lethargic. May be encephalopathic.    Plan Check ammonia level. Start lactulose q 6 hrs for now.   Electronic Signatures: Verdie Shire (MD)  (Signed 08-Jan-14 11:58)  Authored: Chief Complaint, VITAL SIGNS/ANCILLARY NOTES, Brief Assessment, Lab Results, Assessment/Plan   Last Updated: 08-Jan-14 11:58  by Verdie Shire (MD)

## 2015-03-30 NOTE — Consult Note (Signed)
Pt with non alcoholic cirrhosis, severe portal hypertension, esoph varices, watermellon stomach, now severely low platelet counts below 10,  Pt was getting a PIC line when I went to examine and interview her.  All information to this point from old chart.  Discussed with Dr. Sherrlyn HockPandit.  Will start octreotide to lessen chance of bleeding from the abnormalities mentioned above.  No endoscopic action likely to help until plt ct above 50,000.  Electronic Signatures: Scot JunElliott, Lauren Aguayo T (MD)  (Signed on 05-Jan-14 14:13)  Authored  Last Updated: 05-Jan-14 14:13 by Scot JunElliott, Arliss Hepburn T (MD)

## 2015-03-30 NOTE — H&P (Signed)
PATIENT NAME:  Sara RoyalsWILSON, Leeba L MR#:  [id number] DATE OF BIRTH:  1941-03-15  DATE OF ADMISSION:  12/10/2012  PRIMARY CARE PHYSICIAN: Lorie PhenixNancy Maloney, MD  ONCOLOGIST: Benita Gutterobert Gittin, MD  CHIEF COMPLAINT: Weakness.   HISTORY OF PRESENT ILLNESS: This is a 74 year old female who has a history of liver cirrhosis. She has portal hypertension with splenomegaly. She was taking what sounds like Bactrim for her UTI. She has had poor p.o. intake over the past couple of weeks. On New Year's Day, she felt so weak that she fell on the floor and EMS was called. There is bruising on her arm where they helped her up, but she did not come to the hospital. She has just felt even weaker today. Here she is found to be dehydrated and also to have platelets of 6 so we are going to admit her for further treatment.   PAST MEDICAL HISTORY: 1. Nonalcoholic liver cirrhosis.  2. Portal hypertension.  3. Splenomegaly.  4. 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 5. History of esophageal varices.  6. Vitamin B12 deficiency.  7. Ovarian cancer.  8. History of DVT status post IVC filter placement.  9. History of anemia.   PAST SURGICAL HISTORY: 1. Hysterectomy.  2. IVC filter placement.   SOCIAL HISTORY: Does not smoke and does not drink alcohol.   FAMILY HISTORY: Significant for peripheral vascular disease.   ALLERGIES: PENICILLIN AND ZITHROMAX AND ALEVE.  CURRENT MEDICATIONS: 1. Spironolactone 50 mg daily.  2. Propranolol 20 mg 2 times a day. 3. Omeprazole 20 mg 2 times a day. 4. Metformin 500 mg 2 times a day. 5. Magnesium oxide 400 mg daily.  6. K-Dur 10 milliequivalents daily.  7. Lasix 40 mg 2 times a day. 8. Folic acid 1 mg daily.  9. Citalopram 10 mg daily.  10. Ferrous sulfate 325 mg daily.  11. Allopurinol 100 mg daily.   REVIEW OF SYSTEMS:   CONSTITUTIONAL: No fever or chills.   EYES: No blurred vision.   ENT: No hearing loss.   CARDIOVASCULAR: No chest pain.   PULMONARY: No  shortness of breath.   GASTROINTESTINAL: She has splenomegaly. She has had some poor appetite. No nausea or vomiting.   GENITOURINARY: No dysuria.   ENDOCRINE: No heat or cold intolerance.   INTEGUMENTAL: She has had some bruising.   MUSCULOSKELETAL: Occasional joint pain.   NEUROLOGIC: No numbness or weakness.   PHYSICAL EXAMINATION:  VITAL SIGNS: Temperature is 98.4, pulse 73, respirations 20 and blood pressure 107/56.   GENERAL: This is a well-nourished white female in no acute distress.   HEENT: The pupils are equal, round and reactive to light. Sclerae anicteric. Oropharynx is dry.   NECK: Supple. No JVD, lymphadenopathy or thyromegaly.   CARDIOVASCULAR: Regular rate and rhythm. There is a I/VI systolic murmur.   PULMONARY: Lungs are clear to auscultation. No dullness to percussion. She is not using accessory muscles.   ABDOMEN: Distended but tall tautly. There is mild hepatomegaly. There is fairly large splenomegaly with some very mild tenderness to deep palpation.   EXTREMITIES: There is 1+ lower extremity edema.   NEUROLOGIC: Cranial nerves II through XII are intact. She is alert and oriented x 4.   SKIN: There is no rash, no petechiae.   LABORATORY STUDIES: BUN is 24 and creatinine 1.32. Urinalysis has 2+ bacteria. White blood cells are 9.5, hemoglobin 9.3 and platelets are 6.   ASSESSMENT AND PLAN: 1. Acute idiopathic thrombocytopenia purpura probably induced by antibiotics, specifically the  Bactrim. She finished it 2 days ago. We will go ahead and start her on some steroids with Solu-Medrol 60 mg q.12 hours. I discussed with Dr. Sherrlyn Hock and he agrees with platelet transfusion, although she is showing no signs of bleeding at this point. We will follow up her platelets and we will also check a LDH. She has recently had other chemistries such as folic acid, B12 and iron studies already done. I will leave it to Dr. Sherrlyn Hock if he wants to repeat those.  2. Dehydration. I  think it is from poor appetite. We will just give her some IV fluids gently. 3. Recent urinary tract infection. Since she has recently been on antibiotics, I am going to just culture her urine for now before starting any other antibiotics.  4. Cirrhosis. This appears to be stable.  5. Splenomegaly. From past history, this also appears to be stable. She could be sequestering some platelets, but I feel like problem number 1 is mainly from the antibiotic use.   TIME SPENT ON ADMISSION: 50 minutes.  ____________________________ Gracelyn Nurse, MD jdj:sb D: 12/10/2012 14:16:28 ET T: 12/10/2012 14:38:11 ET JOB#: [id number]  cc: Gracelyn Nurse, MD, <Dictator> Leo Grosser, MD Knute Neu. Lorre Nick, MD  Gracelyn Nurse MD ELECTRONICALLY SIGNED 12/10/2012 21:59

## 2015-03-30 NOTE — Consult Note (Signed)
HEMATOLOGY followup - ambulated to BR, feels tired but no progressive SOB or dizziness. Stools dark, stool hemoccult is positive. Denies other bleeding symptoms. no fevers. No new cough or chest pain.A, O x 3, NAD. Pallor +      vitals - 97, 59, 18, 106/55, 96% on RA            HEENT -  no oral sores or petechiae            lungs - b/l good air entry            abd - soft, NT            skin - bruising over forearms unchanged. Hb 6.5, WBC 11900, platelets 13K, stool hemoccult positive.Peripheral smear did not show schistocytes. Recent Cr 1.45, LDH normal.  74 year old female patient with history of multiple medical problems including nonalcoholic cirrhosis with portal hypertension and splenomegaly/hypersplenism with baseline low blood counts including thrombocytopenia, admitted with severe worsening of thrombocytopenia most likely Bactrim induced, also has progressive anemia with evidence of GI bleeding. Platelet count is slightly better at 14K. Agree with ongoing plan, continue supportive platelet and PRBC transfusion, empiric steroid with Solu-Medrol. Continue folic acid 1 mg daily. B12 level has been sent, have given vit B12 100 mcg SQ injection yesterday. Avoid Bactrim usage in the future. Continue to monitor daily CBC and watch for bleeding symptoms. Will get Platelet antibody panel sent tomorrw. Hematology will continue to follow. The patient was explained above, she is agreeable to this plan  Electronic Signatures: Izola PricePandit, Morris Markham Raj (MD)  (Signed on 05-Jan-14 12:17)  Authored  Last Updated: 05-Jan-14 12:17 by Izola PricePandit, Psalm Arman Raj (MD)

## 2015-03-30 NOTE — Consult Note (Signed)
Chief Complaint:   Subjective/Chief Complaint Pt well known to me with frequent blood transfusions and cauterizations of watermelon stomach every few months. Now signif drop in platelets. No signif improvement with platelet transfusion. On IV solumedrol now. Given PRBC last night as well. Still feels weak and poor appetite. Passing dark stools.   VITAL SIGNS/ANCILLARY NOTES: **Vital Signs.:   06-Jan-14 09:23   Vital Signs Type Routine   Temperature Temperature (F) 97.6   Celsius 36.4   Temperature Source oral   Pulse Pulse 55   Respirations Respirations 20   Systolic BP Systolic BP 110   Diastolic BP (mmHg) Diastolic BP (mmHg) 58   Mean BP 75   BP Source  if not from Vital Sign Device non-invasive   Pulse Ox % Pulse Ox % 94   Pulse Ox Activity Level  At rest   Oxygen Delivery Room Air/ 21 %   Brief Assessment:   Cardiac Regular    Respiratory clear BS    Gastrointestinal Normal   Lab Results: Routine Chem:  05-Jan-14 06:11    Result Comment Platelet - RESULTS VERIFIED BY REPEAT TESTING.  - VERIFIED BY SMEAR ESTIMATE  - CRITICAL VALUE PREVIOUSLY NOTIFIED.  Result(s) reported on 12 Dec 2012 at 07:13AM.  06-Jan-14 05:56    Result Comment LABS - This specimen was collected through an   - indwelling catheter or arterial line.  - A minimum of 5mls of blood was wasted prior    - to collecting the sample.  Interpret  - results with caution. PLATELET - RESULTS VERIFIED BY REPEAT TESTING.  - CRITICAL VALUE PREVIOUSLY NOTIFIED.  Result(s) reported on 13 Dec 2012 at 06:20AM.  Routine Hem:  05-Jan-14 06:11    Platelet Count (CBC)  13   WBC (CBC)  11.9   RBC (CBC)  1.99   Hemoglobin (CBC)  6.5   Hematocrit (CBC)  20.2   MCV  102   MCH 32.8   MCHC 32.3   RDW  18.8   Neutrophil % 90.7   Lymphocyte % 6.7   Monocyte % 2.5   Eosinophil % 0.0   Basophil % 0.1   Neutrophil #  10.8   Lymphocyte #  0.8   Monocyte # 0.3   Eosinophil # 0.0   Basophil # 0.0 (Result(s) reported  on 12 Dec 2012 at 06:55AM.)  06-Jan-14 05:56    Platelet Count (CBC)  8   Assessment/Plan:  Assessment/Plan:   Assessment Severe thrombocytopenia, prob related to medication.    Plan Continue to moniter PLTC and hgb. Continue octreotide/protonix. No immediate plans for EGD. Once PLTC stabilized, will consider EGD then. [facility] Will check back on Wed. Thanks.   Electronic Signatures: Lutricia Feilh, Teagon Kron (MD)  (Signed 06-Jan-14 12:33)  Authored: Chief Complaint, VITAL SIGNS/ANCILLARY NOTES, Brief Assessment, Lab Results, Assessment/Plan   Last Updated: 06-Jan-14 12:33 by Lutricia Feilh, Shirely Toren (MD)

## 2015-03-30 NOTE — Consult Note (Signed)
PATIENT NAME:  Sara Hampton, Sara Hampton MR#:  [id number] DATE OF BIRTH:  1941-05-05  DATE OF CONSULTATION:  12/12/2012  REFERRING PHYSICIAN:   CONSULTING PHYSICIAN:  Scot Junobert T. Elliott, MD  REASON FOR CONSULTATION:  Melena, severe thrombocytopenia.   HISTORY OF PRESENT ILLNESS:  The patient is a 74 year old white female patient of Dr. Nigel Bridgemanh's who has nonalcoholic cirrhosis, history of portal hypertension, splenomegaly, chronic thrombocytopenia with platelets normally running between 60 and 100,000. She has a watermelon stomach and has had recurrent treatments for this. She also has a history of esophageal varices, previous DVT, previous IVC filter placement, B12 deficiency, hysterectomy, ovarian mass.   The patient had symptoms of a UTI recently and was treated with antibiotics. She thinks it was 2 different antibiotics, but one of them likely was Bactrim. On November 18th her platelet count was 159 and on this admission, it had dropped down to a single digit.   FAMILY HISTORY:  No hematologic disorders or malignancy.   SOCIAL HISTORY:  Does not smoke or drink.   ALLERGIES:  PENICILLIN, ALEVE AND ZITHROMAX.   MEDICATIONS AT HOME:  Spironolactone 50 mg a day, Prozac 20 mg b.i.d., metformin 500 mg b.i.d., magnesium oxide 400 mg a day, propranolol 20 mg b.i.d., K-Dur 10 mEq daily, Lasix 40 mg b.i.d., folic acid 1 mg daily, citalopram 10 mg daily, ferrous sulfate 325 mg daily, allopurinol 100 mg daily.   REVIEW OF SYSTEMS:  Please see note by Dr. Sherrlyn HockPandit. The patient had some mild abdominal discomfort across the lower abdomen. She has had dark stools; the darkest was a couple of days ago. It is less dark in the last day or two. These stools are darker in general than her other stools.   PHYSICAL EXAMINATION: GENERAL: An elderly white female in no acute distress, alert and awake and appropriate.  HEENT: Sclerae anicteric. Conjunctivae pale. Tongue somewhat pale.  HEAD: Atraumatic. She does have some bruising  in different places. Trachea is in the midline.  CHEST: Clear in the anterior fields.  HEART: No murmurs or gallops I can hear.  ABDOMEN: Soft, nontender. No palpable liver. The spleen is palpable.   LABORATORY DATA:  LDH 171, creatinine 1.3, potassium 4.6, albumin 1.6, alkaline phosphatase 171, AST 46, ALT 18, albumin 1.6. CBC showed a white count 9500, hemoglobin 9.3, platelet count of 6000.   ASSESSMENT:  Severe thrombocytopenia into a very dangerous level. Even though she is passing probable bloody stools, there is no role at this time for endoscopy. Recommend proceeding with steroid therapy and platelet transfusions. I would recommend proton pump inhibitor b.i.d. and either a bolus treatment with octreotide or a bolus and a drip depending on intravenous access. Either I will see her or Dr. Bluford Kaufmannh tomorrow.    ____________________________ Scot Junobert T. Elliott, MD rte:si D: 12/12/2012 16:33:24 ET T: 12/12/2012 17:08:23 ET JOB#: [id number]  cc: Scot Junobert T. Elliott, MD, <Dictator> Gracelyn NurseJohn D. Johnston, MD Sandeep R. Sherrlyn HockPandit, MD  Scot JunOBERT T ELLIOTT MD ELECTRONICALLY SIGNED 12/26/2012 9:42

## 2015-03-30 NOTE — Discharge Summary (Signed)
PATIENT NAME:  Sara Hampton, Sara Hampton MR#:  [id number] DATE OF BIRTH:  Oct 12, 1941  DATE OF ADMISSION:  12/10/2012 DATE OF DISCHARGE:  12/16/2012  ADMITTING PHYSICIAN: Dr. Harrel Lemon   DISCHARGING PHYSICIAN: Gladstone Lighter   PRIMARY ONCOLOGIST: Dr. Inez Pilgrim.   PRIMARY CARE PHYSICIAN: Dr. Margarita Rana   CONSULTATIONS IN THE HOSPITAL:  1.  Oncology consultation by Dr. Ma Hillock and also Dr. Inez Pilgrim. 2.  GI consultation by Dr. Vira Agar.  3.  Palliative care consultation by Dr. Izora Gala Phifer.   DISCHARGE DIAGNOSES: 1.  Acute idiopathic thrombocytopenic purpura, likely triggered by Bactrim.  2.  Acute on chronic anemia.  3.  Liver, gallbladder malignancy.  4.  Ovarian malignancy.  5.  Acute renal failure.  6.  Hepatic encephalopathy.  7.  Nonalcoholic liver cirrhosis.  8.  Depression.   DISCHARGE MEDICATIONS:  1.  Roxanol 20 mg/mL of 0.2 to 0.5 mL q. to 2 hours p.r.n. for pain.  2.  Ativan 0.5 to 1 mg tablet p.o. sublingual q.2 to 4 hours p.r.n. for anxiety, nervousness.  3. Ranitidine 150 mg p.o. b.i.d.   DISCHARGE DIET: As tolerated.   DISCHARGE ACTIVITY: As tolerated.    FOLLOWUP INSTRUCTIONS: The patient is being transferred to hospice home.   LABS AND IMAGING STUDIES:  1.  Ammonia at the time of discharge 76.  2.  WBC 11.8, hemoglobin 8.1, hematocrit 23.1, platelet count is 11.  3.  Sodium 141, potassium 4.6, chloride 107, bicarbonate 21, BUN 80, creatinine 2.36, glucose 209, and calcium 8.9.  4.  Ultrasound Doppler's of right upper extremity negative for any DVT.  5.  INR is 1.8.  6.  ALT 34, AST 44, alk phos 124, total bilirubin 1.4, albumin 2.2.   7.  Serum IgA was found to be elevated.  8.  Platelet antibody panel came positive.   BRIEF HOSPITAL COURSE: The patient is a 74 year old Caucasian female with past medical history significant for nonalcoholic liver cirrhosis with portal hypertension and also splenomegaly, also history of esophageal varices and GI bleed from  watermelon stomach. She does have a history of ovarian cancer and diagnosed malignancy of her liver, gallbladder for which she refused further diagnostic procedures. She presented to the hospital secondary to generalized weakness and was found to have extremely low platelets and also hemoglobin. She was recently treated with the Bactrim for a urinary tract infection.  1.  Acute idiopathic thrombocytopenic purpura, likely triggered by Bactrim, also could be worsened by bone marrow suppression. She was started on IV steroids.  She has received them for almost 7 days while in the hospital. Peripheral smear did not reveal any schistocytes indicating TTP. She was seen by oncology all along and has been getting platelet transfusions, at least 1 to 2 units every day since admission.  Her platelets have not shown a great lot of improvement and also she does have acute on chronic anemia secondary to the same reason from bone marrow suppression and has received blood transfusions too while in the hospital without much improvement.  2.  Nonalcoholic liver cirrhosis with hepatic encephalopathy.  The patient's ammonia level elevated at 150, started on lactulose rectally and ammonia down to 76 but mental status not improving, overall clinically declining condition. Also has undiagnosed liver/gallbladder malignancy and ovarian malignancy for which she did not want aggressive intervention or diagnostic procedures. She was also seen at [facility] and they were just monitoring her through tumor markers as an outpatient. She also has cirrhosis, hypersplenism, and portal  hypertension.  Anemia could also be from her low platelets resulting in slow GI bleed as she does have history of watermelon stomach and esophageal varices so she was prophylactically placed on octreotide IV and also Protonix IV as they could not do EGD because of her low platelets. Because of her worsening mental status over the last couple of days and not improving  blood work and clinically palliative care consult was requested. After a family meeting, as the patient already was a DNR, the family has agreed for comfort care and hospice home, which is appropriate in her situation at this time. Her prognosis is extremely poor.   DISCHARGE DISPOSITION: To hospice home.   TIME SPENT ON DISCHARGE: 45 minutes.    ____________________________ Gladstone Lighter, MD rk:cs D: 12/16/2012 14:36:00 ET T: 12/16/2012 20:23:32 ET JOB#: [id number]  cc: Gladstone Lighter, MD, <Dictator> Jerrell Belfast, MD Simonne Come. Inez Pilgrim, MD Gladstone Lighter MD ELECTRONICALLY SIGNED 12/17/2012 14:31

## 2015-04-01 NOTE — Consult Note (Signed)
PATIENT NAME:  Sara Hampton, Sara Hampton MR#:  [id number] DATE OF BIRTH:  02/28/1941  DATE OF CONSULTATION:  04/12/2012  CONSULTING PHYSICIAN:  Ezzard StandingPaul Y. Bluford Kaufmannh, MD  REASON FOR REFERRAL: Hepatic encephalopathy, melena.   DESCRIPTION: The patient is a 74 year old white female with diabetes and hypertension, with cirrhosis from NASH, who has been feeling lethargic and sleepy for the past two weeks or so. We did an upper endoscopy on March 27th because of melena and drop in hemoglobin. 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 She was due for a repeat endoscopy this Thursday on May 9th to evaluate the antrum again. Fortunately for her, her hemoglobin has been relatively stable. The patient was seen by Dr. Lorre NickGittin last Wednesday, May 1st, and discussion was made about  whether to repeat the endoscopy or not.   Anyway, the patient has been feeling more tired and weak, and when she stooped down to pick up something she actually lost her balance and fell backwards. She did not lose any consciousness. The patient was then brought to the Emergency Room and the decision was made to admit the patient for more observation. She was still anemic. The stool was grossly heme positive.   ALLERGIES: She is allergic to penicillin, Zithromax and Aleve.   MEDICATIONS: Her medications include Aldactone daily, folate, Lasix, potassium, iron daily, omeprazole 20 mg daily, allopurinol daily, and metformin as well as Celexa.   PAST MEDICAL HISTORY:  1. Diabetes.  2. Hypertension. 3. Cirrhosis.  4. Kidney stones.  5. History of deep vein thromboses requiring IVC filter.  6. History of colonic polyps and monoclonal gammopathy.  7. Gallbladder masses, inoperable. 8. Adnexal masses being watched at this time.    PAST SURGICAL HISTORY: Hysterectomy.   SOCIAL HISTORY: No smoking or alcohol use.   FAMILY HISTORY:  Notable for stroke, diabetes and peripheral vascular disease.   REVIEW OF SYSTEMS: Please refer to the initial History and Physical dictated on May 5th. There is really no change.   PHYSICAL EXAMINATION:  GENERAL: Currently she actually looks a lot more alert according to the report than yesterday after she was placed on lactulose and Xifaxan.   VITAL SIGNS: This morning she is afebrile, temperature is 98.1, pulse is 78, respirations 18, blood pressure 106/65, pulse is 95 on room air.   HEENT: Normocephalic, atraumatic head. Pupils are equally reactive. Throat was clear.   NECK: Supple.   CARDIAC: Regular rhythm and rate without murmurs.   LUNGS: Clear bilaterally.   ABDOMEN: Normoactive bowel sounds, soft, nontender. I did not appreciate any hepatomegaly. There are no palpable masses. She had active bowel sounds.   EXTREMITIES: Maybe slight pedal edema.   NEUROLOGICAL:   She is fairly alert today. She had nonfocal exam. There is no evidence of asterixis.   LABORATORY, DIAGNOSTIC AND RADIOLOGICAL DATA:  Her ammonia level was 126 and is now down to 83 on medication. Sodium 139, potassium 4.1, chloride 108, CO2 24, BUN 21, creatinine 0.97, glucose is 140.  Liver enzymes are normal.  Troponin level is normal.  White count is 5.1. Hemoglobin was 11.4 yesterday, is 9.7 today. Platelet count is 83.  INR is 1.2.  ASSESSMENT AND PLAN: This is a patient with known cirrhosis with portal gastropathy and watermelon stomach. The patient is having some melena. We will try to schedule an upper endoscopy later today if the schedule allows to evaluate the stomach and,  if necessary, to repeat Argon plasma coagulation. I agree with a combination of lactulose and Xifaxan, it appears to be helping. If she gets discharged soon, then she needs to remain on those medications.   Thank you for the referral.  ____________________________ Ezzard Standing. Bluford Kaufmann, MD pyo:cbb D: 04/12/2012 12:42:24 ET T: 04/12/2012  12:59:30 ET JOB#: [id number]  cc: Ezzard Standing. Bluford Kaufmann, MD, <Dictator> Ezzard Standing Algie Cales MD ELECTRONICALLY SIGNED 04/12/2012 15:36

## 2015-04-01 NOTE — Consult Note (Signed)
Pt well known to me. Hx of cirrhosis with watermelon stomach. Last EGD 6 wks ago. Area cauterized with APC. Due for repeat EGD on Thurs. Increasing lethargy last 2 wks. Melena but no signif drop in hgb. More alert today after starting lactulose/xifaxan. No asterixis now. May try to schedule EGD either later today if schedule allows or on Wed. NPO except meds for now.  Electronic Signatures: Lutricia Feilh, Laporcha Marchesi (MD)  (Signed on 06-May-13 09:16)  Authored  Last Updated: 06-May-13 09:16 by Lutricia Feilh, Abrham Maslowski (MD)

## 2015-04-01 NOTE — Discharge Summary (Signed)
PATIENT NAME:  Sara Hampton, Sara Hampton MR#:  [id number] DATE OF BIRTH:  12-Jan-1941  DATE OF ADMISSION:  04/11/2012 DATE OF DISCHARGE:  04/13/2012  PRIMARY CARE PHYSICIAN: Lorie Phenix, MD   GASTROENTEROLOGIST: Dr. Bluford Kaufmann   DISCHARGE DIAGNOSES:  1. Acute blood loss anemia with EGD showing some active bleeding from antrum area, cauterized with argon plasma coagulation. Bleeding stopped. Tolerated diet subsequently.  2. Liver cirrhosis with chronic thrombocytopenia, stable counts. 3. Hypomagnesemia, repleted and resolved.  4. Possible hepatic encephalopathy with ammonia trending down on lactulose and Zyprexa. Mental status is baseline.  5. History of DVT in the right lower extremity status post IVC filter in the past, following with Dr. Gilda Crease, not a candidate for anticoagulation.  6. 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 Lorre Nick and [facility].    SECONDARY DIAGNOSES:  1. Hypertension.  2. Diabetes.  3. Kidney stone.  4. Cirrhosis due to NASH. 5. Esophageal varices.   6. Iron deficiency anemia.  7. Chronic thrombocytopenia.  8. Monoclonal gammopathy.  9. Ascites.  10. Gallbladder mass, likely malignant.   CONSULTATIONS:  1. Physical therapy  2. GI, Dr. Bluford Kaufmann    PROCEDURES/RADIOLOGY:  1. Endoscopy on May 6th showed grade II esophageal varices. Portal hypertensive gastropathy. Gastric antral vascular ectasia with bleeding. Normal duodenum.  2. Chest x-ray on May 5th showed no acute cardiopulmonary disease.   PERTINENT HOSPITAL DATA: Urinalysis on admission was negative.   HISTORY AND SHORT HOSPITAL COURSE: The patient is a 74 year old female with above-mentioned medical problems who was admitted for acute blood loss anemia. Please see Dr. Corene Cornea dictated history and physical for further details. She was feeling dizzy and fell also at home. GI consultation was obtained with Dr. Bluford Kaufmann who recommended endoscopy which was performed on May 6th with results as dictated  above. Her bleeding site was cauterized and she did not have any further bleeding. She remained hemodynamically stable, tolerated diet well, and was discharged home in stable condition on May 7th.   On the date of discharge her vital signs were as follows: Temperature 97.6, heart rate 74 per minute, respirations 20 per minute, blood pressure 112/69 mmHg. She was saturating 96% on room air.   PERTINENT PHYSICAL EXAMINATION ON THE DATE OF DISCHARGE: CARDIOVASCULAR: S1, S2 normal. No murmurs, rubs, or gallop. LUNGS: Clear to auscultation bilaterally. No wheezing, rales, rhonchi, or crepitation. ABDOMEN: Soft, benign. NEUROLOGIC: Nonfocal examination. All other physical examination remained at the baseline.   DISCHARGE MEDICATIONS:  1. Spironolactone 50 mg p.o. daily.  2. Folic acid 1 mg p.o. every day.  3. Klor-Con 10 mEq p.o. daily. 4. CVS Iron 27 mg p.o. twice a day.  5. Allopurinol 100 mg p.o. at bedtime.  6. Metformin 500 mg p.o. b.i.d.  7. Citalopram 10 mg p.o. daily. 8. Rifaximin 550 mg p.o. b.i.d. for two weeks.  9. Lactulose 30 mL four times a day for two weeks.  10. Omeprazole 40 mg p.o. b.i.d.  11. Lasix 20 mg p.o. daily.   DISCHARGE DIET: Low sodium.   DISCHARGE ACTIVITY: As tolerated.   DISCHARGE INSTRUCTIONS AND FOLLOW-UP:  1. The patient was instructed to follow-up with her primary care physician, Dr. Lorie Phenix, in 1 to 2 weeks.  2. She will need follow-up with Dr. Bluford Kaufmann in 2 to 3 weeks.  3. She was provided front wheeled walker.   Please note she was found to have possible hepatic encephalopathy with ammonia level as high as up to 126 and was started  on lactulose and rifaximin. Her ammonia level was coming down. It was 83 and her mental status was cleared.   ____________________________ Bradyn Soward S. Sherryll BurgerShah, MD vss:drc D: 04/16/2012 10:04:59 ET T: 04/19/2012 09:40:41 ET JOB#: [id number]  cc: Arrietty Dercole S. Sherryll BurgerShah, MD, <Dictator> Leo GrosserNancy J. Maloney, MD Ezzard StandingPaul Y. Bluford Kaufmannh, MD Knute Neuobert G. Lorre NickGittin,  MD Patricia PesaVIPUL S Analyse Angst MD ELECTRONICALLY SIGNED 04/22/2012 9:00

## 2015-04-01 NOTE — H&P (Signed)
PATIENT NAME:  Sara Hampton, Sara Hampton MR#:  [id number] DATE OF BIRTH:  April 02, 1941  DATE OF ADMISSION:  04/11/2012  PRIMARY CARE PHYSICIAN: Dr. Lorie Phenix   GASTROENTEROLOGIST: Dr. Lutricia Feil  ONCOLOGIST: Dr. Benita Gutter   CHIEF COMPLAINT: Dizziness, fall.   HISTORY OF PRESENT ILLNESS: The patient is a 74 year old female with multiple medical problems including diabetes, hypertension, cirrhosis due to NASH, esophageal varies, chronic anemia, who reports that she has been feeling weak dizzy and sleepy for the last few days. She came to the hospital today because she fell at home. She was leaning forward to pick up something from the floor when she lost her balance and fell backwards. She denies any pain anywhere. The patient reports having dark stools. In the Emergency Department, rectal exam revealed melanotic stool strongly positive for blood. The patient had an endoscopy on 03/03/2012 by Dr. Bluford Kaufmann which showed grade 2 esophageal varies and moderate pleural hypertensive gastropathy. 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 The patient reports that she is scheduled to have another endoscopy this coming Thursday to check on the status of her varices as well as the angiectasia in the stomach.  The patient reports that she takes oral iron, has received IV iron and blood transfusions in the past. She has not received any in the last few months because her blood counts have been good.   ALLERGIES: Penicillin, Aleve, Zithromax .    CURRENT MEDICATIONS: The patient did not bring a list of her medications. As per the Prescription Writer, she is taking:  1. Spironolactone 50 mg daily. 2. Folic acid 1 mg daily.  3. Lasix 40 mg daily.  4. KCl 10 mEq daily.  5. Iron 27 mg daily.  6. Omeprazole 20 mg daily.  7. Allopurinol 100 mg daily.  8. Metformin 500 mg daily.  9. Celexa 10 mg daily.   PAST MEDICAL HISTORY:  1. History of kidney  stones. 2. Diabetes. 3. Hypertension. 4. Cirrhosis due to NASH.  5. Esophageal varices. 6. History of right lower extremity deep vein thrombosis, status post IVC filter.  7. Iron deficiency anemia which was attributed to GI bleed and watermelon stomach. 8. History of colonic polyps. 9. Chronic thrombocytopenia felt to be due to cirrhosis and splenomegaly.  10. History of monoclonal gammopathy.  11. Gallstones with dilated common bile duct. The patient also was noted to have a gallbladder mass. She had laparoscopic evaluation for gallbladder surgery, but bleeding was encountered, therefore, the surgery was not performed.  12. Ascites.  13. Gallbladder mass, likely malignant, inoperable.  14. There is a progressive growth adnexal mass with a rising tumor marker CA-125. The patient had has seen Dr. Hyacinth Meeker of Surgery. The mass is likely felt to be malignant.   PAST SURGICAL HISTORY:  1. Recurrent EGDs.  2. Hysterectomy.   SOCIAL HISTORY: Denies any history of smoking, alcohol or drug abuse. She lives alone.   FAMILY HISTORY: Mother had cerebral hemorrhage. Father died of complications related to gangrene secondary to diabetes. He had peripheral vascular disease and also had myocardial infarction.   REVIEW OF SYSTEMS: CONSTITUTIONAL: Denies any fever. Reports fatigue, weakness. EYES: Denies any blurred or double vision. ENT: Denies any tinnitus, ear pain. RESPIRATORY: Denies any cough, wheezing. CARDIOVASCULAR: Denies any chest pain or palpitations. GI: Denies any nausea, vomiting, diarrhea, or abdominal pain. GU: Denies any dysuria or hematuria. ENDOCRINE:  Denies any polyuria or nocturia. HEME/LYMPH: Has chronic anemia, easy bruisability, thrombocytopenia.  INTEGUMENT: Denies any acne, rash. MUSCULOSKELETAL: Denies any swelling or gout. NEUROLOGICAL: Denies any numbness or weakness. PSYCHIATRIC: Denies any anxiety, depression. Currently is on antidepressants.   PHYSICAL EXAMINATION:  VITAL  SIGNS: Temperature 98.2, heart rate 69, respiratory 20, blood pressure 151/71, pulse oximetry 96% on room air.   GENERAL: The patient is an elderly female who looks very tired and fatigued.   HEENT: Head: Atraumatic, normocephalic. Eyes: There is pallor. No cyanosis. ENT: Dry mucous membranes. No oropharyngeal erythema or thrush.   NECK: Supple. No masses. No JVD. No thyromegaly or lymphadenopathy.   CHEST WALL: No tenderness to palpation. Not using accessory muscles of respiration. No intercostal muscle retractions.   LUNGS: Bilaterally clear to auscultation. No wheezing, rales, or rhonchi.   CARDIOVASCULAR: S1, S2 regular. No murmurs, rubs or gallops.   ABDOMEN: Soft, nontender, nondistended. No guarding. No rigidity. Normal bowel sounds.   SKIN: No rashes or lesions.   PERIPHERIES: There is pedal edema, more in the right lower than the left lower extremity.   MUSCULOSKELETAL: No cyanosis or clubbing.   NEUROLOGIC: Awake, alert, oriented x3. Nonfocal neurological exam. Cranial nerves are grossly intact. The patient is having difficulty concentrating.   PSYCHIATRIC: Normal mood and affect.   LABORATORY DATA: White count 7.2, hemoglobin 11.4, hematocrit 34.1, platelets 104.  Glucose 148, BUN 22, creatinine 0.93, sodium 139, potassium 4.2, chloride 106, CO2 25, calcium 8.8, bilirubin 1.1, alkaline phosphatase 143,  ALT 21. AST 38. Lipase 175, magnesium 1.4. INR 1.2. Ammonia 123.  ASSESSMENT AND PLAN: A 74 year old female with past medical history of multiple medical problems, presents with weakness, dizziness and fall.   1. Possible upper gastrointestinal bleed: The patient's hemoglobin is stable, but she reports weakness and dizziness. Her stools were strongly positive for blood. The patient is scheduled to have an endoscopy this coming Thursday for followup of her portal gastropathy, esophageal varices and angiectasias.  We will admit the patient to the hospital, continue Protonix  drip. We will start her on a clear liquid diet. We will monitor hemoglobin and hematocrit and get a GI consultation. We will continue her oral iron supplementation.  2. Liver cirrhosis with chronic thrombocytopenia: The patient has mildly elevated liver function tests, however, her  bilirubin is normal.  3. Hypomagnesemia: We will replace.  4. Diabetes: We will place on a no concentrated diet and insulin sliding scale. We will hold the patient's metformin for the time being.  5. Possible hepatic encephalopathy: The patient is having difficulty concentrating, reports dizziness, weakness, and drowsiness at home. Her ammonia level is elevated at 126. She does not appear to be on any medications for treatment of hyperammonemia. We will start her on lactulose and Xifaxan and recheck ammonia level in a.m.  6. History of deep vein thrombosis in the right lower extremity: The patient has an IVC  filter.  As per the patient,  the IVC filter was supposed to be removed in March, but Dr. Gilda CreaseSchnier is going to wait until June or July of this year to take the filter out. She is not a candidate for anticoagulation.  7. 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 Lorre NickGittin. As per Dr. Paula ComptonGittin's note, the patient cannot have a gallbladder biopsy due to high risk of bleeding. She is also a very poor candidate for chemotherapy. For her GYN tumor, [facility] Palliative care was also being considered.   I reviewed all medical records, discussed  with the ER physician, discussed with the patient the plan of care and management.   TIME SPENT: 75 minutes.  ____________________________ Darrick Meigs, MD sp:cbb D: 04/11/2012 11:13:46 ET T: 04/11/2012 11:38:57 ET JOB#: [id number] cc: Darrick Meigs, MD, <Dictator> Leo Grosser, MD Darrick Meigs MD ELECTRONICALLY SIGNED 04/12/2012 13:18

## 2015-04-01 NOTE — Consult Note (Signed)
EGD showed some active bleeding from antrum. Area cauterized with argon plasma coagulation. Bleeding stopped. Reg diet ordered. If hgb remains stable and patient remains alert by tomorrow, patient can be discharged to home tomorrow with f/u in our office later. Thanks  Electronic Signatures: Lutricia Feilh, Mikhala Kenan (MD)  (Signed on 06-May-13 12:36)  Authored  Last Updated: 06-May-13 12:36 by Lutricia Feilh, Khylin Gutridge (MD)
